# Patient Record
Sex: Female | Born: 1935
Health system: Southern US, Community
[De-identification: ages and names within clinical notes are randomized; demographics above are authoritative.]

## PROBLEM LIST (undated history)

## (undated) DIAGNOSIS — I739 Peripheral vascular disease, unspecified: Secondary | ICD-10-CM

## (undated) DIAGNOSIS — R112 Nausea with vomiting, unspecified: Secondary | ICD-10-CM

## (undated) DIAGNOSIS — I10 Essential (primary) hypertension: Secondary | ICD-10-CM

## (undated) DIAGNOSIS — M1990 Unspecified osteoarthritis, unspecified site: Secondary | ICD-10-CM

## (undated) DIAGNOSIS — I8390 Asymptomatic varicose veins of unspecified lower extremity: Secondary | ICD-10-CM

## (undated) DIAGNOSIS — Z9889 Other specified postprocedural states: Secondary | ICD-10-CM

## (undated) DIAGNOSIS — E785 Hyperlipidemia, unspecified: Secondary | ICD-10-CM

## (undated) DIAGNOSIS — R609 Edema, unspecified: Secondary | ICD-10-CM

## (undated) DIAGNOSIS — I82409 Acute embolism and thrombosis of unspecified deep veins of unspecified lower extremity: Secondary | ICD-10-CM

## (undated) DIAGNOSIS — T8859XA Other complications of anesthesia, initial encounter: Secondary | ICD-10-CM

## (undated) DIAGNOSIS — G629 Polyneuropathy, unspecified: Secondary | ICD-10-CM

## (undated) DIAGNOSIS — I809 Phlebitis and thrombophlebitis of unspecified site: Secondary | ICD-10-CM

## (undated) DIAGNOSIS — T4145XA Adverse effect of unspecified anesthetic, initial encounter: Secondary | ICD-10-CM

## (undated) HISTORY — DX: Peripheral vascular disease, unspecified: I73.9

## (undated) HISTORY — DX: Hyperlipidemia, unspecified: E78.5

## (undated) HISTORY — PX: EYE SURGERY: SHX253

## (undated) HISTORY — DX: Phlebitis and thrombophlebitis of unspecified site: I80.9

## (undated) HISTORY — DX: Asymptomatic varicose veins of unspecified lower extremity: I83.90

## (undated) HISTORY — DX: Edema, unspecified: R60.9

## (undated) HISTORY — PX: BACK SURGERY: SHX140

## (undated) HISTORY — PX: REPLACEMENT TOTAL KNEE: SUR1224

---

## 1991-06-04 HISTORY — PX: FRACTURE SURGERY: SHX138

## 1999-06-04 DIAGNOSIS — I82409 Acute embolism and thrombosis of unspecified deep veins of unspecified lower extremity: Secondary | ICD-10-CM

## 1999-06-04 HISTORY — DX: Acute embolism and thrombosis of unspecified deep veins of unspecified lower extremity: I82.409

## 2001-05-11 ENCOUNTER — Ambulatory Visit (HOSPITAL_COMMUNITY): Admission: RE | Admit: 2001-05-11 | Discharge: 2001-05-11 | Payer: Self-pay | Admitting: Internal Medicine

## 2004-06-18 ENCOUNTER — Ambulatory Visit (HOSPITAL_COMMUNITY): Admission: RE | Admit: 2004-06-18 | Discharge: 2004-06-18 | Payer: Self-pay | Admitting: Neurology

## 2005-04-17 ENCOUNTER — Ambulatory Visit: Payer: Self-pay | Admitting: Internal Medicine

## 2005-04-17 ENCOUNTER — Ambulatory Visit (HOSPITAL_COMMUNITY): Admission: RE | Admit: 2005-04-17 | Discharge: 2005-04-17 | Payer: Self-pay | Admitting: Internal Medicine

## 2010-05-30 ENCOUNTER — Ambulatory Visit (HOSPITAL_COMMUNITY)
Admission: RE | Admit: 2010-05-30 | Discharge: 2010-05-30 | Payer: Self-pay | Source: Home / Self Care | Attending: Internal Medicine | Admitting: Internal Medicine

## 2010-05-30 ENCOUNTER — Ambulatory Visit: Payer: Self-pay | Admitting: Internal Medicine

## 2010-07-04 ENCOUNTER — Other Ambulatory Visit (HOSPITAL_COMMUNITY): Payer: Self-pay | Admitting: Orthopedic Surgery

## 2010-07-04 ENCOUNTER — Encounter (HOSPITAL_COMMUNITY)
Admission: RE | Admit: 2010-07-04 | Discharge: 2010-07-04 | Disposition: A | Discharge status: Home / Self Care | Payer: Medicare Other | Source: Ambulatory Visit | Attending: Orthopedic Surgery | Admitting: Orthopedic Surgery

## 2010-07-04 ENCOUNTER — Encounter (HOSPITAL_COMMUNITY): Payer: Medicare Other

## 2010-07-04 DIAGNOSIS — J449 Chronic obstructive pulmonary disease, unspecified: Secondary | ICD-10-CM | POA: Insufficient documentation

## 2010-07-04 DIAGNOSIS — Z01818 Encounter for other preprocedural examination: Secondary | ICD-10-CM | POA: Insufficient documentation

## 2010-07-04 DIAGNOSIS — M1710 Unilateral primary osteoarthritis, unspecified knee: Secondary | ICD-10-CM | POA: Insufficient documentation

## 2010-07-04 DIAGNOSIS — IMO0002 Reserved for concepts with insufficient information to code with codable children: Secondary | ICD-10-CM | POA: Insufficient documentation

## 2010-07-04 DIAGNOSIS — J4489 Other specified chronic obstructive pulmonary disease: Secondary | ICD-10-CM | POA: Insufficient documentation

## 2010-07-04 LAB — COMPREHENSIVE METABOLIC PANEL
ALT: 15 U/L (ref 0–35)
AST: 18 U/L (ref 0–37)
Albumin: 3.7 g/dL (ref 3.5–5.2)
Alkaline Phosphatase: 58 U/L (ref 39–117)
BUN: 16 mg/dL (ref 6–23)
CO2: 31 mEq/L (ref 19–32)
Calcium: 9.9 mg/dL (ref 8.4–10.5)
Chloride: 101 mEq/L (ref 96–112)
Creatinine, Ser: 0.85 mg/dL (ref 0.4–1.2)
GFR calc Af Amer: >60 mL/min (ref >60)
GFR calc non Af Amer: >60 mL/min (ref >60)
Glucose, Bld: 141 mg/dL — ABNORMAL HIGH (ref 70–99)
Potassium: 4.9 mEq/L (ref 3.5–5.1)
Sodium: 137 mEq/L (ref 135–145)
Total Bilirubin: 0.7 mg/dL (ref 0.3–1.2)
Total Protein: 6.6 g/dL (ref 6.0–8.3)

## 2010-07-04 LAB — DIFFERENTIAL
Basophils Absolute: 0 10*3/uL (ref 0.0–0.1)
Basophils Relative: 1 % (ref 0–1)
Eosinophils Absolute: 0.2 10*3/uL (ref 0.0–0.7)
Eosinophils Relative: 3 % (ref 0–5)
Lymphocytes Relative: 32 % (ref 12–46)
Lymphs Abs: 1.9 10*3/uL (ref 0.7–4.0)
Monocytes Absolute: 0.5 10*3/uL (ref 0.1–1.0)
Monocytes Relative: 9 % (ref 3–12)
Neutro Abs: 3.3 10*3/uL (ref 1.7–7.7)
Neutrophils Relative %: 56 % (ref 43–77)

## 2010-07-04 LAB — URINALYSIS, ROUTINE W REFLEX MICROSCOPIC
Bilirubin Urine: NEGATIVE
Hgb urine dipstick: NEGATIVE
Ketones, ur: NEGATIVE mg/dL
Nitrite: NEGATIVE
Protein, ur: NEGATIVE mg/dL
Specific Gravity, Urine: 1.015 (ref 1.005–1.030)
Urine Glucose, Fasting: NEGATIVE mg/dL
Urobilinogen, UA: 0.2 mg/dL (ref 0.0–1.0)
pH: 6 (ref 5.0–8.0)

## 2010-07-04 LAB — CBC
HCT: 39 % (ref 36.0–46.0)
Hemoglobin: 13 g/dL (ref 12.0–15.0)
MCH: 29.7 pg (ref 26.0–34.0)
MCHC: 33.3 g/dL (ref 30.0–36.0)
MCV: 89.2 fL (ref 78.0–100.0)
Platelets: 259 10*3/uL (ref 150–400)
RBC: 4.37 MIL/uL (ref 3.87–5.11)
RDW: 13.2 % (ref 11.5–15.5)
WBC: 5.9 10*3/uL (ref 4.0–10.5)

## 2010-07-04 LAB — PROTIME-INR
INR: 0.9 (ref 0.00–1.49)
Prothrombin Time: 12.4 seconds (ref 11.6–15.2)

## 2010-07-04 LAB — ABO/RH: ABO/RH(D): O POS

## 2010-07-04 LAB — SURGICAL PCR SCREEN
MRSA, PCR: NEGATIVE
Staphylococcus aureus: POSITIVE — AB

## 2010-07-04 LAB — APTT: aPTT: 28 seconds (ref 24–37)

## 2010-07-06 LAB — URINE CULTURE: Culture  Setup Time: 201202011659

## 2010-07-09 ENCOUNTER — Inpatient Hospital Stay (HOSPITAL_COMMUNITY)
Admission: RE | Admit: 2010-07-09 | Discharge: 2010-07-13 | DRG: 470 | Disposition: A | Discharge status: Home / Self Care | Payer: Medicare Other | Source: Ambulatory Visit | Attending: Orthopedic Surgery | Admitting: Orthopedic Surgery

## 2010-07-09 DIAGNOSIS — D62 Acute posthemorrhagic anemia: Secondary | ICD-10-CM | POA: Diagnosis not present

## 2010-07-09 DIAGNOSIS — M1710 Unilateral primary osteoarthritis, unspecified knee: Principal | ICD-10-CM | POA: Diagnosis present

## 2010-07-09 DIAGNOSIS — E119 Type 2 diabetes mellitus without complications: Secondary | ICD-10-CM | POA: Diagnosis present

## 2010-07-09 LAB — HEMOGLOBIN A1C
Hgb A1c MFr Bld: 8.4 % — ABNORMAL HIGH (ref <5.7)
Mean Plasma Glucose: 194 mg/dL — ABNORMAL HIGH (ref <117)

## 2010-07-09 LAB — GLUCOSE, CAPILLARY
Glucose-Capillary: 172 mg/dL — ABNORMAL HIGH (ref 70–99)
Glucose-Capillary: 116 mg/dL — ABNORMAL HIGH (ref 70–99)
Glucose-Capillary: 113 mg/dL — ABNORMAL HIGH (ref 70–99)
Glucose-Capillary: 106 mg/dL — ABNORMAL HIGH (ref 70–99)
Glucose-Capillary: 110 mg/dL — ABNORMAL HIGH (ref 70–99)
Glucose-Capillary: 101 mg/dL — ABNORMAL HIGH (ref 70–99)
Glucose-Capillary: 245 mg/dL — ABNORMAL HIGH (ref 70–99)
Glucose-Capillary: 251 mg/dL — ABNORMAL HIGH (ref 70–99)

## 2010-07-10 LAB — BASIC METABOLIC PANEL
BUN: 13 mg/dL (ref 6–23)
CO2: 27 mEq/L (ref 19–32)
Calcium: 8.1 mg/dL — ABNORMAL LOW (ref 8.4–10.5)
Chloride: 99 mEq/L (ref 96–112)
Creatinine, Ser: 0.84 mg/dL (ref 0.4–1.2)
GFR calc Af Amer: >60 mL/min (ref >60)
GFR calc non Af Amer: >60 mL/min (ref >60)
Glucose, Bld: 224 mg/dL — ABNORMAL HIGH (ref 70–99)
Potassium: 4.7 mEq/L (ref 3.5–5.1)
Sodium: 131 mEq/L — ABNORMAL LOW (ref 135–145)

## 2010-07-10 LAB — CBC
HCT: 29.4 % — ABNORMAL LOW (ref 36.0–46.0)
Hemoglobin: 10 g/dL — ABNORMAL LOW (ref 12.0–15.0)
MCH: 30.5 pg (ref 26.0–34.0)
MCHC: 34 g/dL (ref 30.0–36.0)
MCV: 89.6 fL (ref 78.0–100.0)
Platelets: 198 10*3/uL (ref 150–400)
RBC: 3.28 MIL/uL — ABNORMAL LOW (ref 3.87–5.11)
RDW: 13.5 % (ref 11.5–15.5)
WBC: 7.4 10*3/uL (ref 4.0–10.5)

## 2010-07-10 LAB — GLUCOSE, CAPILLARY
Glucose-Capillary: 209 mg/dL — ABNORMAL HIGH (ref 70–99)
Glucose-Capillary: 198 mg/dL — ABNORMAL HIGH (ref 70–99)
Glucose-Capillary: 231 mg/dL — ABNORMAL HIGH (ref 70–99)
Glucose-Capillary: 237 mg/dL — ABNORMAL HIGH (ref 70–99)

## 2010-07-11 LAB — BASIC METABOLIC PANEL
BUN: 12 mg/dL (ref 6–23)
CO2: 24 mEq/L (ref 19–32)
Calcium: 7.9 mg/dL — ABNORMAL LOW (ref 8.4–10.5)
Chloride: 96 mEq/L (ref 96–112)
Creatinine, Ser: 0.63 mg/dL (ref 0.4–1.2)
GFR calc Af Amer: >60 mL/min (ref >60)
GFR calc non Af Amer: >60 mL/min (ref >60)
Glucose, Bld: 192 mg/dL — ABNORMAL HIGH (ref 70–99)
Potassium: 4.5 mEq/L (ref 3.5–5.1)
Sodium: 126 mEq/L — ABNORMAL LOW (ref 135–145)

## 2010-07-11 LAB — CBC
HCT: 26.5 % — ABNORMAL LOW (ref 36.0–46.0)
Hemoglobin: 9.1 g/dL — ABNORMAL LOW (ref 12.0–15.0)
MCH: 30 pg (ref 26.0–34.0)
MCHC: 34.3 g/dL (ref 30.0–36.0)
MCV: 87.5 fL (ref 78.0–100.0)
Platelets: 170 10*3/uL (ref 150–400)
RBC: 3.03 MIL/uL — ABNORMAL LOW (ref 3.87–5.11)
RDW: 13.1 % (ref 11.5–15.5)
WBC: 7.7 10*3/uL (ref 4.0–10.5)

## 2010-07-11 LAB — GLUCOSE, CAPILLARY
Glucose-Capillary: 202 mg/dL — ABNORMAL HIGH (ref 70–99)
Glucose-Capillary: 155 mg/dL — ABNORMAL HIGH (ref 70–99)
Glucose-Capillary: 98 mg/dL (ref 70–99)
Glucose-Capillary: 167 mg/dL — ABNORMAL HIGH (ref 70–99)

## 2010-07-12 LAB — CROSSMATCH
ABO/RH(D): O POS
Antibody Screen: NEGATIVE
Unit division: 0
Unit division: 0

## 2010-07-12 LAB — CBC
HCT: 27.4 % — ABNORMAL LOW (ref 36.0–46.0)
Hemoglobin: 9.6 g/dL — ABNORMAL LOW (ref 12.0–15.0)
MCH: 30.5 pg (ref 26.0–34.0)
MCHC: 35 g/dL (ref 30.0–36.0)
MCV: 87 fL (ref 78.0–100.0)
Platelets: 173 10*3/uL (ref 150–400)
RBC: 3.15 MIL/uL — ABNORMAL LOW (ref 3.87–5.11)
RDW: 13.1 % (ref 11.5–15.5)
WBC: 6.5 10*3/uL (ref 4.0–10.5)

## 2010-07-12 LAB — GLUCOSE, CAPILLARY
Glucose-Capillary: 104 mg/dL — ABNORMAL HIGH (ref 70–99)
Glucose-Capillary: 128 mg/dL — ABNORMAL HIGH (ref 70–99)
Glucose-Capillary: 94 mg/dL (ref 70–99)

## 2010-07-12 LAB — URINALYSIS, MICROSCOPIC ONLY
Bilirubin Urine: NEGATIVE
Ketones, ur: NEGATIVE mg/dL
Leukocytes, UA: NEGATIVE
Nitrite: NEGATIVE
Protein, ur: NEGATIVE mg/dL
Specific Gravity, Urine: 1.008 (ref 1.005–1.030)
Urine Glucose, Fasting: 100 mg/dL — AB
Urobilinogen, UA: 0.2 mg/dL (ref 0.0–1.0)
pH: 7 (ref 5.0–8.0)

## 2010-07-12 LAB — BASIC METABOLIC PANEL
BUN: 9 mg/dL (ref 6–23)
CO2: 23 mEq/L (ref 19–32)
Calcium: 8.1 mg/dL — ABNORMAL LOW (ref 8.4–10.5)
Chloride: 99 mEq/L (ref 96–112)
Creatinine, Ser: 0.67 mg/dL (ref 0.4–1.2)
GFR calc Af Amer: >60 mL/min (ref >60)
GFR calc non Af Amer: >60 mL/min (ref >60)
Glucose, Bld: 155 mg/dL — ABNORMAL HIGH (ref 70–99)
Potassium: 3.6 mEq/L (ref 3.5–5.1)
Sodium: 130 mEq/L — ABNORMAL LOW (ref 135–145)

## 2010-07-13 LAB — URINE CULTURE
Colony Count: NO GROWTH
Culture  Setup Time: 201202091733
Culture: NO GROWTH
Special Requests: NEGATIVE

## 2010-07-13 LAB — GLUCOSE, CAPILLARY
Glucose-Capillary: 142 mg/dL — ABNORMAL HIGH (ref 70–99)
Glucose-Capillary: 60 mg/dL — ABNORMAL LOW (ref 70–99)
Glucose-Capillary: 94 mg/dL (ref 70–99)
Glucose-Capillary: 182 mg/dL — ABNORMAL HIGH (ref 70–99)

## 2010-07-19 LAB — GLUCOSE, CAPILLARY: Glucose-Capillary: 120 mg/dL — ABNORMAL HIGH (ref 70–99)

## 2010-07-24 NOTE — Op Note (Signed)
NAME:  Kelly Weaver, Kelly Weaver                ACCOUNT NO.:  [id number]  MEDICAL RECORD NO.:  [id number]           PATIENT TYPE:  I  LOCATION:  5023                         FACILITY:  [facility]  PHYSICIAN:  Mila Homer. Sherlean Foot, M.D. DATE OF BIRTH:  [birth year]  DATE OF PROCEDURE:  07/09/2010 DATE OF DISCHARGE:                              OPERATIVE REPORT   SURGEON:  Mila Homer. Sherlean Foot, MD  ASSISTANT: 1. Altamese Cabal, PA-C 2. Skip Mayer, PA-C  ANESTHESIA:  General.  PREOPERATIVE DIAGNOSIS:  Right knee osteoarthritis.  POSTOPERATIVE DIAGNOSIS:  Right knee osteoarthritis.  PROCEDURE:  Right total knee arthroplasty.  INDICATIONS FOR PROCEDURE:  The patient is a 77 year old white female with failure of conservative measures for osteoarthritis of the knee. Informed consent was obtained.  DESCRIPTION OF PROCEDURE:  The patient was laid supine, administered general anesthesia.  Right knee was prepped and draped in the usual sterile fashion.  Extremity was exsanguinated with the Esmarch and tourniquet inflated to 375 mmHg due to the size of the leg.  I made a midline incision with a #10 blade.  I used a new blade to make a median parapatellar arthrotomy and performed synovectomy.  I elevated the deep MCL off the medial crest of the tibia and then everted the patella and measured 26-mm thick.  I reamed down 8.5 mm, drilled through lug holes through the 35-mm template and had recreated the native thickness.  I removed the prosthetic trial, went into flexion subluxing the patella laterally.  I used extramedullary alignment system on the tibia to make perpendicular cut to the anatomic axis of the tibia.  I then used the intramedullary system on the femur to make 6 degrees valgus cut.  I sized to a size F, pinned through 5 degrees external rotation holes and measured the posterior condylar angle.  I made the anterior and posterior chamfer cuts with a sagittal saw.  I then placed a lamina spreader in  the knee, removed the ACL, PCL, medial, and lateral menisci and posterior condylar osteophytes.  I then finished the femur with a size F finishing block, finished the tibia with a size 5 tibial tray drilling keel.  I then trialed with 5 tibia, F femur, 14 insert, 35 patella, had good flexion/extension, gap balance and good patellar tracking.  I chose these components, removed the trial components, copiously irrigated.  I then cemented the components, removed all excess cement in extension and at this point, I let down tourniquet and obtained hemostasis.  I then placed a Hemovac coming out superolaterally deep to the arthrotomy and pain catheter coming out supermedial and superficial to the arthrotomy.  I closed the arthrotomy with figure-of- eight #1 Vicryl sutures, deep soft tissues buried 0 Vicryl sutures, subcuticular 2-0 Vicryl stitches, and skin staples.  Dressed with Xeroform dressing, sponges, sterile Webril, and TED stocking.  COMPLICATIONS:  None.  DRAINS:  One Hemovac and one pain catheter.  ESTIMATED BLOOD LOSS:  300 mL.  TOURNIQUET TIME:  50 minutes.          ______________________________ Mila Homer Sherlean Foot, M.D.     SDL/MEDQ  D:  07/09/2010  T:  07/10/2010  Job:  [id number]  Electronically Signed by Georgena Spurling M.D. on 07/24/2010 09:07:54 PM

## 2010-08-13 LAB — GLUCOSE, CAPILLARY
Glucose-Capillary: 243 mg/dL — ABNORMAL HIGH (ref 70–99)
Glucose-Capillary: 304 mg/dL — ABNORMAL HIGH (ref 70–99)

## 2010-09-17 NOTE — Discharge Summary (Signed)
NAME:  Kelly Weaver, Kelly Weaver                ACCOUNT NO.:  [id number]  MEDICAL RECORD NO.:  [id number]           PATIENT TYPE:  I  LOCATION:  5023                         FACILITY:  [facility]  PHYSICIAN:  Mila Homer. Sherlean Foot, M.D. DATE OF BIRTH:  [birth year]  DATE OF ADMISSION:  07/09/2010 DATE OF DISCHARGE:  07/13/2010                              DISCHARGE SUMMARY   ADMISSION DIAGNOSIS:  Osteoarthritis of the right knee.  DISCHARGE DIAGNOSES: 1. Osteoarthritis of the right knee. 2. Status post right total knee arthroplasty. 3. Acute blood loss anemia, status post surgery.  PROCEDURE:  Right total knee arthroplasty.  HISTORY:  [name] is a 77 year old female who complained of pain in the right knee times several years, now interfering with activities of daily living.  Conservative treatments failed.  Risk and benefits of surgery were discussed with the patient, and the patient would like to proceed with the right total knee arthroplasty.  She has no known allergies.  ADMISSION MEDICATIONS:  Upon admission to the hospital, the patient was taking: 1. Calcium over-the-counter daily. 2. Crestor 5 mg daily. 3. Aspirin 81 mg daily, stopped on June 29, 2010. 4. Gabapentin 300 mg daily. 5. Metformin 500 mg twice daily. 6. Losartan 25 mg daily. 7. Lantus 19 units at bedtime. 8. NovoLog 6 units every morning. 9. Humalog mix 75/25 28 units daily.  HOSPITAL COURSE:  This is a 77 year old female admitted on July 09, 2010 after appropriate laboratory studies were obtained preoperatively as well as ancef on call to the operating room.  She was taken to the OR where she underwent a right TKA.  She tolerated the procedure well and was taken to PACU in good condition.  CPM was placed at that time and was placed on Dilaudid plus p.o. pain medication as needed.  Foley was placed intraoperatively.  On postop day #1, vital signs were stable.  The patient denied chest pain, shortness of breath,  or calf pain.  The patient was placed on Xarelto 10 mg p.o. daily at 8 a.m.  Consults with PT/OT and Care Management were made.  The patient was weightbearing as tolerated.  CPM 0-9 degrees for 6-8 hours per day and incentive spirometry was taught.  Postop day #2, the patient continued to progress with physical therapy albeit slowly.  Dressing was changed.  Marcaine pump and Hemovac were discontinued.  Foley was discontinued.  The patient was continued on p.o. pain medications.  Postop day #3, the patient continued to progress with physical therapy and was discharged after passing physical therapy goals.  LABORATORY STUDIES:  Upon discharge from the hospital, the patient's H and H was 9.1 and 26.5, white blood cell count was 7.7, platelets of 170.  Sodium was 134, potassium was 3.6, chloride was 99, CO2 was 23, glucose was 155, BUN was 9, creatinine was 0.67.  Discharge instructions were given to the patient.  No restrictions in diet.  She should follow her regular diabetic diet.  Follow the instruction sheet provided.  Increase activity slowly.  May use a cane or walker, weightbearing as tolerated.  No lifting or driving for 6 weeks  and home health has been established.  DISCHARGE MEDICATIONS:  Upon discharge from the hospital, the patient was given: 1. Xarelto 10 mg p.o. daily, #12. 2. Robaxin 500 mg 1-2 tablets every 6-8 hours as needed for spasm. 3. Oxycodone 5 mg 1-2 tablets every 4-6 hours as needed for pain. 4. OxyContin 20 mg 1 tablet twice daily.  The patient will follow with Dr. Sherlean Foot in 2 weeks.  She is to call for that appointment at [phone].  The patient is discharged in improved condition.    ______________________________ Altamese Cabal, PA-C   ______________________________ Mila Homer. Sherlean Foot, M.D.    MJ/MEDQ  D:  08/16/2010  T:  08/17/2010  Job:  [id number]  Electronically Signed by Altamese Cabal PA-C on 09/11/2010 02:27:53 PM Electronically Signed by  Georgena Spurling M.D. on 09/16/2010 02:30:37 PM

## 2010-10-19 NOTE — Op Note (Signed)
NAME:  Kelly Weaver, Kelly Weaver                ACCOUNT NO.:  [id number]   MEDICAL RECORD NO.:  [id number]          PATIENT TYPE:  AMB   LOCATION:  DAY                           FACILITY:  [facility]   PHYSICIAN:  Lionel December, M.D.    DATE OF BIRTH:  Jul 24, 1933   DATE OF PROCEDURE:  04/17/2005  DATE OF DISCHARGE:                                 OPERATIVE REPORT   INDICATIONS:  Kelly Weaver is a 77 year old Caucasian female who is undergoing high-  risk screening colonoscopy. One sister died of colon carcinoma in her 3s;  another one also had disease in her 54s but died of unrelated causes. She  had two small polyps removed her last exam, but these were nonedematous.  Procedure risks were reviewed with the patient, and informed consent was  obtained.   PREMEDICATION:  Demerol 25 mg IV, Versed 5 mg IV in divided dose.   FINDINGS:  Procedure performed in endoscopy suite. The patient's vital signs  and O2 saturation were monitored during the procedure remained stable. The  patient was placed in left lateral position. Rectal examination performed.  No abnormality noted on external or digital exam. Olympus videoscope was  placed in rectum and advanced under vision in sigmoid colon and beyond.  Preparation was satisfactory. Scope was passed into cecum which was  identified by appendiceal orifice and ileocecal valve. She had a solid piece  of stool in this area which was moved around. As the scope was withdrawn,  colonic mucosa was carefully examined. It was normal throughout. Rectal  mucosa similarly was normal. Scope was retroflexed to examine anorectal  junction which was unremarkable. Endoscope was straightened and withdrawn.  The patient tolerated the procedure well.   FINAL DIAGNOSIS:  Normal colonoscopy.   RECOMMENDATIONS:  Will check her glucose levels and give her short-acting  insulin depending on her level. She will resume her Humalog 75/25 at 18  units with her breakfast and thereafter her usual  dose.   She should continue yearly Hemoccults and consider next exam in five years  from now.      Lionel December, M.D.  Electronically Signed     NR/MEDQ  D:  04/17/2005  T:  04/17/2005  Job:  [id number]   cc:   Weyman Pedro, M.D.  [place], [place]

## 2010-11-26 ENCOUNTER — Ambulatory Visit
Admission: RE | Admit: 2010-11-26 | Discharge: 2010-11-26 | Disposition: A | Discharge status: Home / Self Care | Payer: Medicare Other | Source: Ambulatory Visit | Attending: Neurology | Admitting: Neurology

## 2010-11-26 ENCOUNTER — Other Ambulatory Visit: Payer: Self-pay | Admitting: Neurology

## 2010-11-26 ENCOUNTER — Ambulatory Visit
Admission: RE | Admit: 2010-11-26 | Discharge: 2010-11-26 | Disposition: A | Discharge status: Home / Self Care | Payer: 59 | Source: Ambulatory Visit | Attending: Neurology | Admitting: Neurology

## 2010-11-26 DIAGNOSIS — M79606 Pain in leg, unspecified: Secondary | ICD-10-CM

## 2010-11-28 ENCOUNTER — Other Ambulatory Visit: Payer: Self-pay | Admitting: Neurology

## 2010-11-28 DIAGNOSIS — M79609 Pain in unspecified limb: Secondary | ICD-10-CM

## 2010-11-29 ENCOUNTER — Ambulatory Visit
Admission: RE | Admit: 2010-11-29 | Discharge: 2010-11-29 | Disposition: A | Discharge status: Home / Self Care | Payer: Medicare Other | Source: Ambulatory Visit | Attending: Neurology | Admitting: Neurology

## 2010-11-29 DIAGNOSIS — M79609 Pain in unspecified limb: Secondary | ICD-10-CM

## 2010-12-04 ENCOUNTER — Other Ambulatory Visit: Payer: Self-pay | Admitting: Neurology

## 2010-12-04 DIAGNOSIS — M79604 Pain in right leg: Secondary | ICD-10-CM

## 2010-12-06 ENCOUNTER — Ambulatory Visit
Admission: RE | Admit: 2010-12-06 | Discharge: 2010-12-06 | Disposition: A | Discharge status: Home / Self Care | Payer: Medicare Other | Source: Ambulatory Visit | Attending: Neurology | Admitting: Neurology

## 2010-12-06 ENCOUNTER — Other Ambulatory Visit: Payer: Self-pay | Admitting: Neurology

## 2010-12-06 DIAGNOSIS — M79604 Pain in right leg: Secondary | ICD-10-CM

## 2011-01-02 ENCOUNTER — Encounter (HOSPITAL_COMMUNITY)
Admission: RE | Admit: 2011-01-02 | Discharge: 2011-01-02 | Disposition: A | Discharge status: Home / Self Care | Payer: Medicare Other | Source: Ambulatory Visit | Attending: Neurosurgery | Admitting: Neurosurgery

## 2011-01-02 LAB — BASIC METABOLIC PANEL
BUN: 17 mg/dL (ref 6–23)
CO2: 34 mEq/L — ABNORMAL HIGH (ref 19–32)
Calcium: 10.3 mg/dL (ref 8.4–10.5)
Chloride: 101 mEq/L (ref 96–112)
Creatinine, Ser: 0.56 mg/dL (ref 0.50–1.10)
GFR calc Af Amer: >60 mL/min (ref >60)
GFR calc non Af Amer: >60 mL/min (ref >60)
Glucose, Bld: 117 mg/dL — ABNORMAL HIGH (ref 70–99)
Potassium: 5.2 mEq/L — ABNORMAL HIGH (ref 3.5–5.1)
Sodium: 141 mEq/L (ref 135–145)

## 2011-01-02 LAB — CBC
HCT: 38.2 % (ref 36.0–46.0)
Hemoglobin: 13.1 g/dL (ref 12.0–15.0)
MCH: 30.6 pg (ref 26.0–34.0)
MCHC: 34.3 g/dL (ref 30.0–36.0)
MCV: 89.3 fL (ref 78.0–100.0)
Platelets: 248 10*3/uL (ref 150–400)
RBC: 4.28 MIL/uL (ref 3.87–5.11)
RDW: 13.5 % (ref 11.5–15.5)
WBC: 5.4 10*3/uL (ref 4.0–10.5)

## 2011-01-02 LAB — SURGICAL PCR SCREEN
MRSA, PCR: NEGATIVE
Staphylococcus aureus: NEGATIVE

## 2011-01-10 ENCOUNTER — Inpatient Hospital Stay (HOSPITAL_COMMUNITY): Payer: Medicare Other

## 2011-01-10 ENCOUNTER — Inpatient Hospital Stay (HOSPITAL_COMMUNITY)
Admission: RE | Admit: 2011-01-10 | Discharge: 2011-01-11 | DRG: 491 | Disposition: A | Discharge status: Home / Self Care | Payer: Medicare Other | Source: Ambulatory Visit | Attending: Neurosurgery | Admitting: Neurosurgery

## 2011-01-10 DIAGNOSIS — M5126 Other intervertebral disc displacement, lumbar region: Principal | ICD-10-CM | POA: Diagnosis present

## 2011-01-10 DIAGNOSIS — Z01812 Encounter for preprocedural laboratory examination: Secondary | ICD-10-CM

## 2011-01-10 DIAGNOSIS — Z794 Long term (current) use of insulin: Secondary | ICD-10-CM

## 2011-01-10 DIAGNOSIS — M51379 Other intervertebral disc degeneration, lumbosacral region without mention of lumbar back pain or lower extremity pain: Secondary | ICD-10-CM | POA: Diagnosis present

## 2011-01-10 DIAGNOSIS — E119 Type 2 diabetes mellitus without complications: Secondary | ICD-10-CM | POA: Diagnosis present

## 2011-01-10 DIAGNOSIS — M5137 Other intervertebral disc degeneration, lumbosacral region: Secondary | ICD-10-CM | POA: Diagnosis present

## 2011-01-10 DIAGNOSIS — Z7982 Long term (current) use of aspirin: Secondary | ICD-10-CM

## 2011-01-10 DIAGNOSIS — Z96659 Presence of unspecified artificial knee joint: Secondary | ICD-10-CM

## 2011-01-10 DIAGNOSIS — Z86718 Personal history of other venous thrombosis and embolism: Secondary | ICD-10-CM

## 2011-01-10 DIAGNOSIS — Z79899 Other long term (current) drug therapy: Secondary | ICD-10-CM

## 2011-01-10 DIAGNOSIS — M47817 Spondylosis without myelopathy or radiculopathy, lumbosacral region: Secondary | ICD-10-CM | POA: Diagnosis present

## 2011-01-10 LAB — GLUCOSE, CAPILLARY
Glucose-Capillary: 149 mg/dL — ABNORMAL HIGH (ref 70–99)
Glucose-Capillary: 217 mg/dL — ABNORMAL HIGH (ref 70–99)
Glucose-Capillary: 195 mg/dL — ABNORMAL HIGH (ref 70–99)
Glucose-Capillary: 311 mg/dL — ABNORMAL HIGH (ref 70–99)
Glucose-Capillary: 404 mg/dL — ABNORMAL HIGH (ref 70–99)
Glucose-Capillary: 348 mg/dL — ABNORMAL HIGH (ref 70–99)
Glucose-Capillary: 324 mg/dL — ABNORMAL HIGH (ref 70–99)

## 2011-01-11 LAB — POCT I-STAT GLUCOSE
Glucose, Bld: 192 mg/dL — ABNORMAL HIGH (ref 70–99)
Operator id: 212621

## 2011-01-11 LAB — GLUCOSE, CAPILLARY
Glucose-Capillary: 254 mg/dL — ABNORMAL HIGH (ref 70–99)
Glucose-Capillary: 281 mg/dL — ABNORMAL HIGH (ref 70–99)

## 2011-01-24 NOTE — Op Note (Signed)
Kelly Weaver, [name]                ACCOUNT NO.:  [id number]  MEDICAL RECORD NO.:  [id number]  LOCATION:  3528                         FACILITY:  [facility]  PHYSICIAN:  Hewitt Shorts, M.D.DATE OF BIRTH:  Dec 18, 1933  DATE OF PROCEDURE:  01/10/2011 DATE OF DISCHARGE:                              OPERATIVE REPORT   PREOPERATIVE DIAGNOSES:  Right L4-5 lumbar disk herniation, lumbar degenerative disk disease, lumbar spondylosis and lumbar radiculopathy.  POSTOPERATIVE DIAGNOSES:  Right L4-5 lumbar disk herniation, lumbar degenerative disk disease, lumbar spondylosis and lumbar radiculopathy.  PROCEDURE:  Right L4-5 lumbar laminotomy and microdiskectomy with microdissection and the operating microscope.  SURGEON:  Hewitt Shorts, MD  ASSISTANT:  Stefani Dama, MD  ANESTHESIA:  General endotracheal.  INDICATIONS:  The patient is a 77 year old woman who presented with right lumbar radiculopathy.  MRI scan revealed right L4-5 disk herniation with a fragment that had migrated caudally behind the body of L5.  Decision made to proceed with laminotomy and microdiskectomy.  PROCEDURE:  The patient was brought to the operating room and placed under general endotracheal anesthesia. The patient was turned to a prone position.  Lumbar region was prepped with Betadine soap and solution and draped in sterile fashion.  The midline was infiltrated with local anesthetic with epinephrine and an x-ray was taken, the L4-5 level identified.  A midline incision was made over the L4-5 level, carried down through the subcutaneous tissue.  Bipolar cautery and electrocautery were used to maintain hemostasis.  The lumbar fascia was then incised and paraspinal muscles were dissected from the spinous process and lamina in subperiosteal fashion.  Additional x-rays were taken.  The L4-5 interlaminar space was identified and then the operating microscope was draped and brought into the field  for additional navigation, illumination and visualization.  The remainder of the decompression was performed using microdissection microsurgical technique.  Laminotomy was performed using the high-speed drill and Kerrison punches.  Ligamentum flavum was carefully removed.  We identified the thecal sac and exiting L5 nerve roots.  These structures were gently retracted medially and the disk herniation identified, it was subligamentous and we incised the remaining ligamentous fibers and spondylotic fragment was removed.  We continued to remove the spondylotic overgrowth and subligamentous disk herniation, entered into the disk space and continued diskectomy using variety of pituitary rongeurs.  In the end, all loose fragments of disk material removed from both disk space and epidural space and the spondylotic impingement as well was removed and good decompression of thecal sac and nerve root was achieved.  The wound was irrigated with bacitracin solution.  Hemostasis was established with the use of bipolar cautery as well as Gelfoam soaked in thrombin. The Gelfoam was removed. The wound irrigated again. Hemostasis confirmed and then we instilled 2 mL of fentanyl and 80 mg of Depo-Medrol into the epidural space and proceeded with closure.  Deep fascia closed with interrupted undyed 1 Vicryl sutures.  Scarpa fascia closed with interrupted undyed 1 Vicryl sutures.  Subcutaneous, subcuticular closed with interrupted inverted 2-0 undyed Vicryl sutures. Skin was approximated with Dermabond.  Procedure was tolerated well. Estimated blood loss was 50 mL.  Sponge and needle count  correct. Following surgery, the patient was turned back to supine position, to be reversed from the anesthetic, extubated and transferred to the recovery room for further care.     Hewitt Shorts, M.D.     RWN/MEDQ  D:  01/10/2011  T:  01/10/2011  Job:  [id number]  Electronically Signed by Shirlean Kelly M.D. on  01/24/2011 08:10:59 AM

## 2011-01-30 DIAGNOSIS — R0602 Shortness of breath: Secondary | ICD-10-CM

## 2011-03-05 ENCOUNTER — Ambulatory Visit
Admission: RE | Admit: 2011-03-05 | Discharge: 2011-03-05 | Disposition: A | Discharge status: Home / Self Care | Payer: Medicare Other | Source: Ambulatory Visit | Attending: Neurology | Admitting: Neurology

## 2011-03-05 ENCOUNTER — Other Ambulatory Visit: Payer: Self-pay | Admitting: Neurology

## 2011-03-05 DIAGNOSIS — R52 Pain, unspecified: Secondary | ICD-10-CM

## 2011-03-19 ENCOUNTER — Encounter: Payer: Self-pay | Admitting: Vascular Surgery

## 2011-03-25 ENCOUNTER — Encounter: Payer: Self-pay | Admitting: Vascular Surgery

## 2011-03-26 ENCOUNTER — Ambulatory Visit (INDEPENDENT_AMBULATORY_CARE_PROVIDER_SITE_OTHER): Payer: 59 | Admitting: Vascular Surgery

## 2011-03-26 ENCOUNTER — Encounter: Payer: Self-pay | Admitting: Vascular Surgery

## 2011-03-26 VITALS — BP 137/74 | HR 70 | Resp 16 | Ht 65.0 in | Wt 178.4 lb

## 2011-03-26 DIAGNOSIS — I70219 Atherosclerosis of native arteries of extremities with intermittent claudication, unspecified extremity: Secondary | ICD-10-CM

## 2011-03-26 NOTE — Progress Notes (Signed)
The patient presents today for evaluation of lower extremity symptoms. She reports a long history of left leg swelling. She had a right total knee replacement in February of 2012. He had no difficulties with this. He had lumbar disc surgery in August of 2012. She reports bilateral lower extremity swelling following the procedure and this is resolved to some degree but still has persistent swelling in her left leg. She has no history of DVT. She did undergo venous duplex a copy of the report from this from August of 2012 showing no evidence of DVT. She did have some thrombus in her small saphenous vein on the left. She does have a history of some bleeding from a superficial telangiectasia at the level of her left ankle while shaving. This was treated with pressure.  Past Medical History  Diagnosis Date  . Diabetes mellitus   . Peripheral vascular disease   . Hyperlipidemia   . Edema   . Thrombophlebitis   . Varicose veins     History  Substance Use Topics  . Smoking status: Never Smoker   . Smokeless tobacco: Not on file  . Alcohol Use: No    History reviewed. No pertinent family history.  No Known Allergies  Current outpatient prescriptions:aspirin EC 81 MG tablet, Take 81 mg by mouth daily.  , Disp: , Rfl: ;  Calcium Carbonate-Vitamin D (CALCIUM 600+D) 600-200 MG-UNIT TABS, Take 1 tablet by mouth 3 (three) times daily. , Disp: , Rfl: ;  fish oil-omega-3 fatty acids 1000 MG capsule, Take 2 g by mouth daily. Taking two 1200 mg. Tablets at dinner., Disp: , Rfl: ;  gabapentin (NEURONTIN) 300 MG capsule, Take 300 mg by mouth at bedtime. , Disp: , Rfl:  glucose blood test strip, 1 each by Other route as needed. Use as instructed , Disp: , Rfl: ;  insulin aspart (NOVOLOG) 100 UNIT/ML injection, Inject 6 Units into the skin daily. , Disp: , Rfl: ;  insulin glargine (LANTUS) 100 UNIT/ML injection, Inject 19 Units into the skin at bedtime. , Disp: , Rfl:  insulin lispro protamine-insulin lispro  (HUMALOG 75/25) (75-25) 100 UNIT/ML SUSP, Inject 29 Units into the skin daily with breakfast. , Disp: , Rfl: ;  Insulin Pen Needle (NOVOFINE 31) 31G X 6 MM MISC, by Does not apply route.  , Disp: , Rfl: ;  metFORMIN (GLUCOPHAGE) 500 MG tablet, Take 500 mg by mouth 2 (two) times daily with a meal.  , Disp: , Rfl: ;  rosuvastatin (CRESTOR) 5 MG tablet, Take 5 mg by mouth daily.  , Disp: , Rfl:  valsartan (DIOVAN) 40 MG tablet, Take 40 mg by mouth daily.  , Disp: , Rfl: ;  zoster vaccine live, PF, (ZOSTAVAX) 96045 UNT/0.65ML injection, Inject 0.65 mLs into the skin once.  , Disp: , Rfl: ;  furosemide (LASIX) 40 MG tablet, Take 40 mg by mouth 2 (two) times daily.  , Disp: , Rfl: ;  HYDROmorphone (DILAUDID) 2 MG tablet, Take 2 mg by mouth every 4 (four) hours as needed.  , Disp: , Rfl:  meclizine (ANTIVERT) 25 MG tablet, Take 25 mg by mouth 3 (three) times daily as needed.  , Disp: , Rfl: ;  methocarbamol (ROBAXIN) 500 MG tablet, Take 500 mg by mouth 2 (two) times daily.  , Disp: , Rfl: ;  Multiple Vitamin (MULTIVITAMIN) capsule, Take 1 capsule by mouth daily.  , Disp: , Rfl: ;  nystatin (MYCOSTATIN) 100000 UNIT/ML suspension, Take 500,000 Units by mouth 2 (two)  times daily.  , Disp: , Rfl:  oxyCODONE-acetaminophen (PERCOCET) 7.5-325 MG per tablet, Take 1 tablet by mouth every 4 (four) hours as needed.  , Disp: , Rfl: ;  potassium chloride (KLOR-CON) 10 MEQ CR tablet, Take 10 mEq by mouth 2 (two) times daily.  , Disp: , Rfl: ;  Red Yeast Rice 600 MG CAPS, Take 1 capsule by mouth daily.  , Disp: , Rfl: ;  vitamin E 400 UNIT capsule, Take 400 Units by mouth daily.  , Disp: , Rfl:   BP 137/74  Pulse 70  Resp 16  Ht 5\' 5"  (1.651 m)  Wt 178 lb 6.4 oz (80.922 kg)  BMI 29.69 kg/m2  Body mass index is 29.69 kg/(m^2).       Review of systems: GU frequent urination. Musculoskeletal arthritis. Otherwise negative except for history of present illness.  Physical exam: Well-developed well-nourished white female no  acute distress. HEENT normal. Radial femoral and dorsalis pedis pulses 2+ bilaterally. A well-healed incision over her right anterior knee from knee replacement. Otherwise no major deformities musculoskeletal system. She does have no open ulcerations or skin but does have marked telangiectasia over her left ankle and does have varicosities in the medial aspect of her left calf.  Impression and plan: Normal arterial exam in her lower extremities.-like swelling in her left leg most likely related to valvular incompetence. She did have a venous duplex showing no evidence of DVT in August of 2012. She is not having any pain associated with the varicosities and no significant complications other than one slight episode of bleeding. I discussed the importance of continued elevation. Also recommended knee-high graduated compression garments for her left leg to reduce her swelling and venous hypertension. Would not recommend any further evaluation or treatment this times and she has minimal symptoms and no significant complications. She was reassured with this discussion will see [place] again on an as-needed basis

## 2011-04-03 DIAGNOSIS — H269 Unspecified cataract: Secondary | ICD-10-CM | POA: Insufficient documentation

## 2011-04-09 ENCOUNTER — Other Ambulatory Visit: Payer: Medicare Other

## 2011-04-11 ENCOUNTER — Encounter: Payer: Self-pay | Admitting: Vascular Surgery

## 2011-04-11 DIAGNOSIS — I70219 Atherosclerosis of native arteries of extremities with intermittent claudication, unspecified extremity: Secondary | ICD-10-CM | POA: Insufficient documentation

## 2011-05-03 ENCOUNTER — Encounter: Payer: Self-pay | Admitting: Internal Medicine

## 2011-05-08 ENCOUNTER — Other Ambulatory Visit: Payer: Self-pay | Admitting: Dermatology

## 2012-09-22 DIAGNOSIS — Z96659 Presence of unspecified artificial knee joint: Secondary | ICD-10-CM | POA: Insufficient documentation

## 2012-10-19 ENCOUNTER — Encounter (HOSPITAL_COMMUNITY): Payer: Self-pay | Admitting: Pharmacy Technician

## 2012-10-21 ENCOUNTER — Other Ambulatory Visit: Payer: Self-pay | Admitting: Orthopedic Surgery

## 2012-10-23 ENCOUNTER — Ambulatory Visit (HOSPITAL_COMMUNITY)
Admission: RE | Admit: 2012-10-23 | Discharge: 2012-10-23 | Disposition: A | Discharge status: Home / Self Care | Payer: Medicare Other | Source: Ambulatory Visit | Attending: Orthopedic Surgery | Admitting: Orthopedic Surgery

## 2012-10-23 ENCOUNTER — Other Ambulatory Visit (HOSPITAL_COMMUNITY): Payer: Medicare Other

## 2012-10-23 ENCOUNTER — Encounter (HOSPITAL_COMMUNITY): Payer: Self-pay

## 2012-10-23 ENCOUNTER — Encounter (HOSPITAL_COMMUNITY)
Admission: RE | Admit: 2012-10-23 | Discharge: 2012-10-23 | Disposition: A | Discharge status: Home / Self Care | Payer: Medicare Other | Source: Ambulatory Visit | Attending: Orthopedic Surgery | Admitting: Orthopedic Surgery

## 2012-10-23 DIAGNOSIS — Z01812 Encounter for preprocedural laboratory examination: Secondary | ICD-10-CM | POA: Insufficient documentation

## 2012-10-23 DIAGNOSIS — E119 Type 2 diabetes mellitus without complications: Secondary | ICD-10-CM | POA: Insufficient documentation

## 2012-10-23 DIAGNOSIS — Z01818 Encounter for other preprocedural examination: Secondary | ICD-10-CM | POA: Insufficient documentation

## 2012-10-23 DIAGNOSIS — I10 Essential (primary) hypertension: Secondary | ICD-10-CM | POA: Insufficient documentation

## 2012-10-23 HISTORY — DX: Unspecified osteoarthritis, unspecified site: M19.90

## 2012-10-23 HISTORY — DX: Polyneuropathy, unspecified: G62.9

## 2012-10-23 HISTORY — DX: Other specified postprocedural states: Z98.890

## 2012-10-23 HISTORY — DX: Nausea with vomiting, unspecified: R11.2

## 2012-10-23 HISTORY — DX: Acute embolism and thrombosis of unspecified deep veins of unspecified lower extremity: I82.409

## 2012-10-23 HISTORY — DX: Other complications of anesthesia, initial encounter: T88.59XA

## 2012-10-23 HISTORY — DX: Adverse effect of unspecified anesthetic, initial encounter: T41.45XA

## 2012-10-23 LAB — SURGICAL PCR SCREEN
MRSA, PCR: NEGATIVE
Staphylococcus aureus: NEGATIVE

## 2012-10-23 LAB — CBC WITH DIFFERENTIAL/PLATELET
Basophils Absolute: 0 10*3/uL (ref 0.0–0.1)
Basophils Relative: 1 % (ref 0–1)
Eosinophils Absolute: 0.2 10*3/uL (ref 0.0–0.7)
Eosinophils Relative: 4 % (ref 0–5)
HCT: 40.1 % (ref 36.0–46.0)
Hemoglobin: 14 g/dL (ref 12.0–15.0)
Lymphocytes Relative: 37 % (ref 12–46)
Lymphs Abs: 1.9 10*3/uL (ref 0.7–4.0)
MCH: 31 pg (ref 26.0–34.0)
MCHC: 34.9 g/dL (ref 30.0–36.0)
MCV: 88.7 fL (ref 78.0–100.0)
Monocytes Absolute: 0.6 10*3/uL (ref 0.1–1.0)
Monocytes Relative: 12 % (ref 3–12)
Neutro Abs: 2.4 10*3/uL (ref 1.7–7.7)
Neutrophils Relative %: 47 % (ref 43–77)
Platelets: 267 10*3/uL (ref 150–400)
RBC: 4.52 MIL/uL (ref 3.87–5.11)
RDW: 13.4 % (ref 11.5–15.5)
WBC: 5.2 10*3/uL (ref 4.0–10.5)

## 2012-10-23 LAB — COMPREHENSIVE METABOLIC PANEL
ALT: 14 U/L (ref 0–35)
AST: 16 U/L (ref 0–37)
Albumin: 3.7 g/dL (ref 3.5–5.2)
Alkaline Phosphatase: 86 U/L (ref 39–117)
BUN: 14 mg/dL (ref 6–23)
CO2: 30 mEq/L (ref 19–32)
Calcium: 10.5 mg/dL (ref 8.4–10.5)
Chloride: 100 mEq/L (ref 96–112)
Creatinine, Ser: 0.67 mg/dL (ref 0.50–1.10)
GFR calc Af Amer: >90 mL/min (ref >90)
GFR calc non Af Amer: 83 mL/min — ABNORMAL LOW (ref >90)
Glucose, Bld: 88 mg/dL (ref 70–99)
Potassium: 4.4 mEq/L (ref 3.5–5.1)
Sodium: 139 mEq/L (ref 135–145)
Total Bilirubin: 0.6 mg/dL (ref 0.3–1.2)
Total Protein: 7.4 g/dL (ref 6.0–8.3)

## 2012-10-23 LAB — URINALYSIS, ROUTINE W REFLEX MICROSCOPIC
Bilirubin Urine: NEGATIVE
Glucose, UA: 100 mg/dL — AB
Hgb urine dipstick: NEGATIVE
Ketones, ur: NEGATIVE mg/dL
Leukocytes, UA: NEGATIVE
Nitrite: NEGATIVE
Protein, ur: NEGATIVE mg/dL
Specific Gravity, Urine: 1.014 (ref 1.005–1.030)
Urobilinogen, UA: 0.2 mg/dL (ref 0.0–1.0)
pH: 7 (ref 5.0–8.0)

## 2012-10-23 LAB — TYPE AND SCREEN
ABO/RH(D): O POS
Antibody Screen: NEGATIVE

## 2012-10-23 LAB — PROTIME-INR
INR: 0.89 (ref 0.00–1.49)
Prothrombin Time: 12 seconds (ref 11.6–15.2)

## 2012-10-23 LAB — APTT: aPTT: 27 seconds (ref 24–37)

## 2012-10-23 NOTE — Pre-Procedure Instructions (Addendum)
Kelly Weaver  10/23/2012   Your procedure is scheduled on:  Monday, June 2nd.  Report to [facility] at 5:30 AM.  Call this number if you have problems the morning of surgery: [phone]   Remember:   Do not eat food or drink liquids after midnight.   Take these medicines the morning of surgery with A SIP OF WATER:  gabapentin (NEURONTIN).   Stop taking Aspirin, Coumadin, Plavix, Effient and Herbal medications (Fish Oil).  Do not take any NSAIDs ie: Ibuprofen,  Advil,Naproxen or any medication containing Aspirin.    Do not wear jewelry, make-up or nail polish.  Do not wear lotions, powders, or perfumes. You may wear deodorant.  Do not shave 48 hours prior to surgery. Men may shave face and neck.  Do not bring valuables to the hospital.  Contacts, dentures or bridgework may not be worn into surgery.  Leave suitcase in the car. After surgery it may be brought to your room.  For patients admitted to the hospital, checkout time is 11:00 AM the day of discharge.   Patients discharged the day of surgery will not be allowed to drive home.  Name and phone number of your driver: -   Special Instructions: Shower using CHG 2 nights before surgery and the night before surgery.  If you shower the day of surgery use CHG.  Use special wash - you have one bottle of CHG for all showers.  You should use approximately 1/3 of the bottle for each shower. N/A   Please read over the following fact sheets that you were given: Pain Booklet, Coughing and Deep Breathing, Blood Transfusion Information and Surgical Site Infection Prevention

## 2012-10-24 LAB — URINE CULTURE: Colony Count: 15000

## 2012-10-27 ENCOUNTER — Other Ambulatory Visit: Payer: Self-pay | Admitting: Orthopedic Surgery

## 2012-11-01 MED ORDER — CEFAZOLIN SODIUM-DEXTROSE 2-3 GM-% IV SOLR
2.0000 g | INTRAVENOUS | Status: AC
  Administered 2012-11-02: 2 g via INTRAVENOUS
  Filled 2012-11-01: qty 50

## 2012-11-02 ENCOUNTER — Encounter (HOSPITAL_COMMUNITY)
Admission: RE | Disposition: A | Discharge status: Home Health | Payer: Self-pay | Source: Ambulatory Visit | Attending: Orthopedic Surgery

## 2012-11-02 ENCOUNTER — Encounter (HOSPITAL_COMMUNITY): Payer: Self-pay | Admitting: Surgery

## 2012-11-02 ENCOUNTER — Inpatient Hospital Stay (HOSPITAL_COMMUNITY)
Admission: RE | Admit: 2012-11-02 | Discharge: 2012-11-05 | DRG: 470 | Disposition: A | Discharge status: Home Health | Payer: Medicare Other | Source: Ambulatory Visit | Attending: Orthopedic Surgery | Admitting: Orthopedic Surgery

## 2012-11-02 ENCOUNTER — Inpatient Hospital Stay (HOSPITAL_COMMUNITY): Payer: Medicare Other | Admitting: Certified Registered"

## 2012-11-02 ENCOUNTER — Encounter (HOSPITAL_COMMUNITY): Payer: Self-pay | Admitting: Certified Registered"

## 2012-11-02 DIAGNOSIS — I798 Other disorders of arteries, arterioles and capillaries in diseases classified elsewhere: Secondary | ICD-10-CM

## 2012-11-02 DIAGNOSIS — I70219 Atherosclerosis of native arteries of extremities with intermittent claudication, unspecified extremity: Secondary | ICD-10-CM | POA: Diagnosis present

## 2012-11-02 DIAGNOSIS — M1710 Unilateral primary osteoarthritis, unspecified knee: Principal | ICD-10-CM | POA: Diagnosis present

## 2012-11-02 DIAGNOSIS — Z96652 Presence of left artificial knee joint: Secondary | ICD-10-CM

## 2012-11-02 DIAGNOSIS — D62 Acute posthemorrhagic anemia: Secondary | ICD-10-CM | POA: Diagnosis not present

## 2012-11-02 DIAGNOSIS — Z86718 Personal history of other venous thrombosis and embolism: Secondary | ICD-10-CM

## 2012-11-02 DIAGNOSIS — Z79899 Other long term (current) drug therapy: Secondary | ICD-10-CM

## 2012-11-02 DIAGNOSIS — I10 Essential (primary) hypertension: Secondary | ICD-10-CM

## 2012-11-02 DIAGNOSIS — Z7901 Long term (current) use of anticoagulants: Secondary | ICD-10-CM

## 2012-11-02 DIAGNOSIS — E1149 Type 2 diabetes mellitus with other diabetic neurological complication: Secondary | ICD-10-CM | POA: Diagnosis present

## 2012-11-02 DIAGNOSIS — IMO0002 Reserved for concepts with insufficient information to code with codable children: Secondary | ICD-10-CM

## 2012-11-02 DIAGNOSIS — E1142 Type 2 diabetes mellitus with diabetic polyneuropathy: Secondary | ICD-10-CM | POA: Diagnosis present

## 2012-11-02 DIAGNOSIS — E785 Hyperlipidemia, unspecified: Secondary | ICD-10-CM

## 2012-11-02 DIAGNOSIS — E1159 Type 2 diabetes mellitus with other circulatory complications: Secondary | ICD-10-CM | POA: Diagnosis present

## 2012-11-02 DIAGNOSIS — K5900 Constipation, unspecified: Secondary | ICD-10-CM | POA: Diagnosis not present

## 2012-11-02 DIAGNOSIS — Z96659 Presence of unspecified artificial knee joint: Secondary | ICD-10-CM

## 2012-11-02 DIAGNOSIS — Z794 Long term (current) use of insulin: Secondary | ICD-10-CM

## 2012-11-02 DIAGNOSIS — E1351 Other specified diabetes mellitus with diabetic peripheral angiopathy without gangrene: Secondary | ICD-10-CM

## 2012-11-02 DIAGNOSIS — E1365 Other specified diabetes mellitus with hyperglycemia: Secondary | ICD-10-CM

## 2012-11-02 HISTORY — PX: TOTAL KNEE ARTHROPLASTY: SHX125

## 2012-11-02 LAB — GLUCOSE, CAPILLARY
Glucose-Capillary: 245 mg/dL — ABNORMAL HIGH (ref 70–99)
Glucose-Capillary: 278 mg/dL — ABNORMAL HIGH (ref 70–99)
Glucose-Capillary: 262 mg/dL — ABNORMAL HIGH (ref 70–99)
Glucose-Capillary: 428 mg/dL — ABNORMAL HIGH (ref 70–99)
Glucose-Capillary: 505 mg/dL — ABNORMAL HIGH (ref 70–99)
Glucose-Capillary: 524 mg/dL — ABNORMAL HIGH (ref 70–99)
Glucose-Capillary: 407 mg/dL — ABNORMAL HIGH (ref 70–99)
Glucose-Capillary: 288 mg/dL — ABNORMAL HIGH (ref 70–99)

## 2012-11-02 LAB — CBC
HCT: 37.2 % (ref 36.0–46.0)
Hemoglobin: 12.7 g/dL (ref 12.0–15.0)
MCH: 30.8 pg (ref 26.0–34.0)
MCHC: 34.1 g/dL (ref 30.0–36.0)
MCV: 90.3 fL (ref 78.0–100.0)
Platelets: 198 10*3/uL (ref 150–400)
RBC: 4.12 MIL/uL (ref 3.87–5.11)
RDW: 13.3 % (ref 11.5–15.5)
WBC: 8 10*3/uL (ref 4.0–10.5)

## 2012-11-02 LAB — CREATININE, SERUM
Creatinine, Ser: 0.61 mg/dL (ref 0.50–1.10)
GFR calc Af Amer: >90 mL/min (ref >90)
GFR calc non Af Amer: 86 mL/min — ABNORMAL LOW (ref >90)

## 2012-11-02 LAB — HEMOGLOBIN A1C
Hgb A1c MFr Bld: 7.7 % — ABNORMAL HIGH (ref <5.7)
Mean Plasma Glucose: 174 mg/dL — ABNORMAL HIGH (ref <117)

## 2012-11-02 LAB — GLUCOSE, RANDOM: Glucose, Bld: 461 mg/dL — ABNORMAL HIGH (ref 70–99)

## 2012-11-02 SURGERY — ARTHROPLASTY, KNEE, TOTAL
Anesthesia: General | Site: Knee | Laterality: Left | Wound class: Clean

## 2012-11-02 MED ORDER — ATORVASTATIN CALCIUM 10 MG PO TABS
10.0000 mg | ORAL_TABLET | Freq: Every day | ORAL | Status: DC
  Administered 2012-11-02 – 2012-11-04 (×3): 10 mg via ORAL
  Filled 2012-11-02 (×4): qty 1

## 2012-11-02 MED ORDER — CEFAZOLIN SODIUM-DEXTROSE 2-3 GM-% IV SOLR
INTRAVENOUS | Status: AC
  Filled 2012-11-02: qty 50

## 2012-11-02 MED ORDER — GABAPENTIN 300 MG PO CAPS
300.0000 mg | ORAL_CAPSULE | Freq: Two times a day (BID) | ORAL | Status: DC
  Administered 2012-11-02 – 2012-11-04 (×6): 300 mg via ORAL
  Filled 2012-11-02 (×8): qty 1

## 2012-11-02 MED ORDER — HYDROMORPHONE HCL PF 1 MG/ML IJ SOLN
INTRAMUSCULAR | Status: AC
  Filled 2012-11-02: qty 1

## 2012-11-02 MED ORDER — DOCUSATE SODIUM 100 MG PO CAPS
100.0000 mg | ORAL_CAPSULE | Freq: Two times a day (BID) | ORAL | Status: DC
  Administered 2012-11-02 – 2012-11-04 (×5): 100 mg via ORAL
  Filled 2012-11-02 (×7): qty 1

## 2012-11-02 MED ORDER — PROPOFOL 10 MG/ML IV BOLUS
INTRAVENOUS | Status: DC | PRN
  Administered 2012-11-02: 130 mg via INTRAVENOUS

## 2012-11-02 MED ORDER — SODIUM CHLORIDE 0.9 % IV SOLN: INTRAVENOUS | Status: DC

## 2012-11-02 MED ORDER — ONDANSETRON HCL 4 MG PO TABS: 4.0000 mg | ORAL_TABLET | Freq: Four times a day (QID) | ORAL | Status: DC | PRN

## 2012-11-02 MED ORDER — INSULIN ASPART 100 UNIT/ML ~~LOC~~ SOLN
6.0000 [IU] | Freq: Once | SUBCUTANEOUS | Status: AC
  Administered 2012-11-02: 6 [IU] via SUBCUTANEOUS

## 2012-11-02 MED ORDER — SENNOSIDES-DOCUSATE SODIUM 8.6-50 MG PO TABS: 1.0000 | ORAL_TABLET | Freq: Every evening | ORAL | Status: DC | PRN

## 2012-11-02 MED ORDER — CHLORHEXIDINE GLUCONATE 4 % EX LIQD: 60.0000 mL | Freq: Once | CUTANEOUS | Status: DC

## 2012-11-02 MED ORDER — ENOXAPARIN SODIUM 30 MG/0.3ML ~~LOC~~ SOLN
30.0000 mg | Freq: Two times a day (BID) | SUBCUTANEOUS | Status: DC
  Administered 2012-11-03 – 2012-11-05 (×5): 30 mg via SUBCUTANEOUS
  Filled 2012-11-02 (×7): qty 0.3

## 2012-11-02 MED ORDER — METOCLOPRAMIDE HCL 5 MG/ML IJ SOLN: 5.0000 mg | Freq: Three times a day (TID) | INTRAMUSCULAR | Status: DC | PRN

## 2012-11-02 MED ORDER — BISACODYL 5 MG PO TBEC: 5.0000 mg | DELAYED_RELEASE_TABLET | Freq: Every day | ORAL | Status: DC | PRN

## 2012-11-02 MED ORDER — PROMETHAZINE HCL 25 MG/ML IJ SOLN: 6.2500 mg | INTRAMUSCULAR | Status: DC | PRN

## 2012-11-02 MED ORDER — SODIUM CHLORIDE 0.9 % IV SOLN
INTRAVENOUS | Status: DC
Start: 2012-11-02 — End: 2012-11-05
  Administered 2012-11-02 – 2012-11-03 (×2): via INTRAVENOUS

## 2012-11-02 MED ORDER — ACETAMINOPHEN 650 MG RE SUPP: 650.0000 mg | Freq: Four times a day (QID) | RECTAL | Status: DC | PRN

## 2012-11-02 MED ORDER — MEPERIDINE HCL 25 MG/ML IJ SOLN: 6.2500 mg | INTRAMUSCULAR | Status: DC | PRN

## 2012-11-02 MED ORDER — ONDANSETRON HCL 4 MG/2ML IJ SOLN
INTRAMUSCULAR | Status: DC | PRN
  Administered 2012-11-02: 4 mg via INTRAVENOUS

## 2012-11-02 MED ORDER — BUPIVACAINE HCL (PF) 0.5 % IJ SOLN
INTRAMUSCULAR | Status: DC | PRN
  Administered 2012-11-02: 30 mL

## 2012-11-02 MED ORDER — METHOCARBAMOL 500 MG PO TABS
500.0000 mg | ORAL_TABLET | Freq: Four times a day (QID) | ORAL | Status: DC | PRN
  Administered 2012-11-03 – 2012-11-05 (×4): 500 mg via ORAL
  Filled 2012-11-02 (×4): qty 1

## 2012-11-02 MED ORDER — CEFAZOLIN SODIUM 1-5 GM-% IV SOLN
INTRAVENOUS | Status: AC
  Filled 2012-11-02: qty 50

## 2012-11-02 MED ORDER — LIDOCAINE HCL (CARDIAC) 20 MG/ML IV SOLN
INTRAVENOUS | Status: DC | PRN
  Administered 2012-11-02: 20 mg via INTRAVENOUS

## 2012-11-02 MED ORDER — INSULIN GLARGINE 100 UNIT/ML ~~LOC~~ SOLN
27.0000 [IU] | Freq: Every day | SUBCUTANEOUS | Status: DC
  Administered 2012-11-02: 27 [IU] via SUBCUTANEOUS
  Filled 2012-11-02 (×2): qty 0.27

## 2012-11-02 MED ORDER — BUPIVACAINE HCL (PF) 0.5 % IJ SOLN
INTRAMUSCULAR | Status: AC
  Filled 2012-11-02: qty 30

## 2012-11-02 MED ORDER — INSULIN ASPART 100 UNIT/ML ~~LOC~~ SOLN: 12.0000 [IU] | Freq: Once | SUBCUTANEOUS | Status: DC

## 2012-11-02 MED ORDER — LACTATED RINGERS IV SOLN
INTRAVENOUS | Status: DC | PRN
  Administered 2012-11-02 (×2): via INTRAVENOUS

## 2012-11-02 MED ORDER — CELECOXIB 200 MG PO CAPS
200.0000 mg | ORAL_CAPSULE | Freq: Two times a day (BID) | ORAL | Status: DC
  Administered 2012-11-02 – 2012-11-04 (×7): 200 mg via ORAL
  Filled 2012-11-02 (×8): qty 1

## 2012-11-02 MED ORDER — MENTHOL 3 MG MT LOZG: 1.0000 | LOZENGE | OROMUCOSAL | Status: DC | PRN

## 2012-11-02 MED ORDER — METHOCARBAMOL 100 MG/ML IJ SOLN
500.0000 mg | Freq: Four times a day (QID) | INTRAMUSCULAR | Status: DC | PRN
  Filled 2012-11-02: qty 5

## 2012-11-02 MED ORDER — CEFAZOLIN SODIUM-DEXTROSE 2-3 GM-% IV SOLR
2.0000 g | Freq: Four times a day (QID) | INTRAVENOUS | Status: AC
  Administered 2012-11-02 (×2): 2 g via INTRAVENOUS
  Filled 2012-11-02 (×2): qty 50

## 2012-11-02 MED ORDER — MIDAZOLAM HCL 2 MG/2ML IJ SOLN: 0.5000 mg | Freq: Once | INTRAMUSCULAR | Status: DC | PRN

## 2012-11-02 MED ORDER — IRBESARTAN 75 MG PO TABS
75.0000 mg | ORAL_TABLET | Freq: Every day | ORAL | Status: DC
  Administered 2012-11-02 – 2012-11-04 (×3): 75 mg via ORAL
  Filled 2012-11-02 (×4): qty 1

## 2012-11-02 MED ORDER — SCOPOLAMINE 1 MG/3DAYS TD PT72
1.0000 | MEDICATED_PATCH | TRANSDERMAL | Status: DC
  Administered 2012-11-02: 1.5 mg via TRANSDERMAL
  Filled 2012-11-02 (×2): qty 1

## 2012-11-02 MED ORDER — FLEET ENEMA 7-19 GM/118ML RE ENEM: 1.0000 | ENEMA | Freq: Once | RECTAL | Status: AC | PRN

## 2012-11-02 MED ORDER — ACETAMINOPHEN 10 MG/ML IV SOLN
INTRAVENOUS | Status: AC
  Filled 2012-11-02: qty 100

## 2012-11-02 MED ORDER — SCOPOLAMINE 1 MG/3DAYS TD PT72
MEDICATED_PATCH | TRANSDERMAL | Status: AC
  Administered 2012-11-02: 1 via TRANSDERMAL
  Filled 2012-11-02: qty 1

## 2012-11-02 MED ORDER — HYDROMORPHONE HCL PF 1 MG/ML IJ SOLN
INTRAMUSCULAR | Status: DC | PRN
  Administered 2012-11-02 (×2): 0.5 mg via INTRAVENOUS

## 2012-11-02 MED ORDER — INSULIN ASPART 100 UNIT/ML ~~LOC~~ SOLN: 5.0000 [IU] | Freq: Two times a day (BID) | SUBCUTANEOUS | Status: DC

## 2012-11-02 MED ORDER — TRANEXAMIC ACID 100 MG/ML IV SOLN
1000.0000 mg | INTRAVENOUS | Status: AC
  Administered 2012-11-02: 1000 mg via INTRAVENOUS
  Filled 2012-11-02: qty 10

## 2012-11-02 MED ORDER — HYDROMORPHONE HCL PF 1 MG/ML IJ SOLN: 0.2500 mg | INTRAMUSCULAR | Status: DC | PRN

## 2012-11-02 MED ORDER — ACETAMINOPHEN 10 MG/ML IV SOLN
1000.0000 mg | Freq: Once | INTRAVENOUS | Status: AC
  Administered 2012-11-02: 1000 mg via INTRAVENOUS

## 2012-11-02 MED ORDER — PHENOL 1.4 % MT LIQD: 1.0000 | OROMUCOSAL | Status: DC | PRN

## 2012-11-02 MED ORDER — EPHEDRINE SULFATE 50 MG/ML IJ SOLN
INTRAMUSCULAR | Status: DC | PRN
  Administered 2012-11-02: 10 mg via INTRAVENOUS
  Administered 2012-11-02: 5 mg via INTRAVENOUS
  Administered 2012-11-02: 10 mg via INTRAVENOUS

## 2012-11-02 MED ORDER — ZOLPIDEM TARTRATE 5 MG PO TABS: 5.0000 mg | ORAL_TABLET | Freq: Every evening | ORAL | Status: DC | PRN

## 2012-11-02 MED ORDER — OXYCODONE HCL 5 MG PO TABS
5.0000 mg | ORAL_TABLET | ORAL | Status: DC | PRN
  Administered 2012-11-02: 5 mg via ORAL
  Administered 2012-11-03: 10 mg via ORAL
  Administered 2012-11-03: 5 mg via ORAL
  Administered 2012-11-04 – 2012-11-05 (×4): 10 mg via ORAL
  Filled 2012-11-02 (×2): qty 2
  Filled 2012-11-02: qty 1
  Filled 2012-11-02 (×2): qty 2
  Filled 2012-11-02: qty 1
  Filled 2012-11-02: qty 2

## 2012-11-02 MED ORDER — INSULIN ASPART 100 UNIT/ML ~~LOC~~ SOLN
0.0000 [IU] | Freq: Three times a day (TID) | SUBCUTANEOUS | Status: DC
  Administered 2012-11-02: 12 [IU] via SUBCUTANEOUS
  Administered 2012-11-03 (×2): 8 [IU] via SUBCUTANEOUS
  Administered 2012-11-03: 11 [IU] via SUBCUTANEOUS
  Administered 2012-11-04 – 2012-11-05 (×4): 8 [IU] via SUBCUTANEOUS

## 2012-11-02 MED ORDER — METOCLOPRAMIDE HCL 10 MG PO TABS: 5.0000 mg | ORAL_TABLET | Freq: Three times a day (TID) | ORAL | Status: DC | PRN

## 2012-11-02 MED ORDER — SODIUM CHLORIDE 0.9 % IJ SOLN
INTRAMUSCULAR | Status: DC | PRN
  Administered 2012-11-02: 08:00:00

## 2012-11-02 MED ORDER — METFORMIN HCL 500 MG PO TABS
500.0000 mg | ORAL_TABLET | Freq: Two times a day (BID) | ORAL | Status: DC
  Filled 2012-11-02 (×2): qty 1

## 2012-11-02 MED ORDER — OXYCODONE HCL ER 10 MG PO T12A
10.0000 mg | EXTENDED_RELEASE_TABLET | Freq: Two times a day (BID) | ORAL | Status: DC
  Administered 2012-11-02: 10 mg via ORAL
  Filled 2012-11-02: qty 1

## 2012-11-02 MED ORDER — HYDROMORPHONE HCL PF 1 MG/ML IJ SOLN
1.0000 mg | INTRAMUSCULAR | Status: DC | PRN
  Administered 2012-11-02: 1 mg via INTRAVENOUS
  Filled 2012-11-02: qty 1

## 2012-11-02 MED ORDER — OXYCODONE HCL 5 MG/5ML PO SOLN: 5.0000 mg | Freq: Once | ORAL | Status: DC | PRN

## 2012-11-02 MED ORDER — ALUM & MAG HYDROXIDE-SIMETH 200-200-20 MG/5ML PO SUSP: 30.0000 mL | ORAL | Status: DC | PRN

## 2012-11-02 MED ORDER — DIPHENHYDRAMINE HCL 12.5 MG/5ML PO ELIX: 12.5000 mg | ORAL_SOLUTION | ORAL | Status: DC | PRN

## 2012-11-02 MED ORDER — INSULIN GLARGINE 100 UNIT/ML ~~LOC~~ SOLN
18.0000 [IU] | Freq: Every day | SUBCUTANEOUS | Status: DC
  Filled 2012-11-02: qty 0.18

## 2012-11-02 MED ORDER — LORATADINE 10 MG PO TABS
10.0000 mg | ORAL_TABLET | Freq: Every day | ORAL | Status: DC
  Administered 2012-11-02 – 2012-11-04 (×3): 10 mg via ORAL
  Filled 2012-11-02 (×4): qty 1

## 2012-11-02 MED ORDER — ONDANSETRON HCL 4 MG/2ML IJ SOLN: 4.0000 mg | Freq: Four times a day (QID) | INTRAMUSCULAR | Status: DC | PRN

## 2012-11-02 MED ORDER — ACETAMINOPHEN 325 MG PO TABS: 650.0000 mg | ORAL_TABLET | Freq: Four times a day (QID) | ORAL | Status: DC | PRN

## 2012-11-02 MED ORDER — OXYCODONE HCL 5 MG PO TABS: 5.0000 mg | ORAL_TABLET | Freq: Once | ORAL | Status: DC | PRN

## 2012-11-02 MED ORDER — FENTANYL CITRATE 0.05 MG/ML IJ SOLN
INTRAMUSCULAR | Status: DC | PRN
  Administered 2012-11-02: 50 ug via INTRAVENOUS
  Administered 2012-11-02: 100 ug via INTRAVENOUS
  Administered 2012-11-02 (×2): 50 ug via INTRAVENOUS

## 2012-11-02 MED ORDER — BUPIVACAINE LIPOSOME 1.3 % IJ SUSP
20.0000 mL | Freq: Once | INTRAMUSCULAR | Status: DC
  Filled 2012-11-02: qty 20

## 2012-11-02 MED ORDER — SODIUM CHLORIDE 0.9 % IR SOLN
Status: DC | PRN
  Administered 2012-11-02: 3000 mL

## 2012-11-02 SURGICAL SUPPLY — 55 items
BANDAGE ESMARK 6X9 LF (GAUZE/BANDAGES/DRESSINGS) ×1 IMPLANT
BLADE SAGITTAL 13X1.27X60 (BLADE) ×2 IMPLANT
BLADE SAW SGTL 83.5X18.5 (BLADE) ×2 IMPLANT
BNDG ESMARK 6X9 LF (GAUZE/BANDAGES/DRESSINGS) ×2
BOWL SMART MIX CTS (DISPOSABLE) ×2 IMPLANT
CATH KIT ON Q 5IN SLV (PAIN MANAGEMENT) ×2 IMPLANT
CEMENT BONE SIMPLEX SPEEDSET (Cement) ×4 IMPLANT
CLOTH BEACON ORANGE TIMEOUT ST (SAFETY) ×2 IMPLANT
COVER SURGICAL LIGHT HANDLE (MISCELLANEOUS) ×2 IMPLANT
CUFF TOURNIQUET SINGLE 34IN LL (TOURNIQUET CUFF) ×2 IMPLANT
DRAPE EXTREMITY T 121X128X90 (DRAPE) ×2 IMPLANT
DRAPE INCISE IOBAN 66X45 STRL (DRAPES) ×4 IMPLANT
DRAPE PROXIMA HALF (DRAPES) ×2 IMPLANT
DRAPE U-SHAPE 47X51 STRL (DRAPES) ×2 IMPLANT
DRSG ADAPTIC 3X8 NADH LF (GAUZE/BANDAGES/DRESSINGS) ×2 IMPLANT
DRSG PAD ABDOMINAL 8X10 ST (GAUZE/BANDAGES/DRESSINGS) ×2 IMPLANT
DURAPREP 26ML APPLICATOR (WOUND CARE) ×4 IMPLANT
ELECT REM PT RETURN 9FT ADLT (ELECTROSURGICAL) ×2
ELECTRODE REM PT RTRN 9FT ADLT (ELECTROSURGICAL) ×1 IMPLANT
EVACUATOR 1/8 PVC DRAIN (DRAIN) ×2 IMPLANT
GLOVE BIOGEL M 7.0 STRL (GLOVE) IMPLANT
GLOVE BIOGEL PI IND STRL 7.5 (GLOVE) IMPLANT
GLOVE BIOGEL PI IND STRL 8.5 (GLOVE) ×1 IMPLANT
GLOVE BIOGEL PI INDICATOR 7.5 (GLOVE)
GLOVE BIOGEL PI INDICATOR 8.5 (GLOVE) ×1
GLOVE BIOGEL PI ORTHO PRO SZ8 (GLOVE) ×1
GLOVE PI ORTHO PRO STRL SZ8 (GLOVE) ×1 IMPLANT
GLOVE SURG ORTHO 8.0 STRL STRW (GLOVE) ×4 IMPLANT
GOWN PREVENTION PLUS XLARGE (GOWN DISPOSABLE) ×4 IMPLANT
GOWN STRL NON-REIN LRG LVL3 (GOWN DISPOSABLE) ×4 IMPLANT
HANDPIECE INTERPULSE COAX TIP (DISPOSABLE) ×1
HOOD PEEL AWAY FACE SHEILD DIS (HOOD) ×8 IMPLANT
KIT BASIN OR (CUSTOM PROCEDURE TRAY) ×2 IMPLANT
KIT ROOM TURNOVER OR (KITS) ×2 IMPLANT
MANIFOLD NEPTUNE II (INSTRUMENTS) ×2 IMPLANT
NEEDLE 22X1 1/2 (OR ONLY) (NEEDLE) IMPLANT
NS IRRIG 1000ML POUR BTL (IV SOLUTION) ×2 IMPLANT
PACK TOTAL JOINT (CUSTOM PROCEDURE TRAY) ×2 IMPLANT
PAD ARMBOARD 7.5X6 YLW CONV (MISCELLANEOUS) ×4 IMPLANT
PADDING CAST COTTON 6X4 STRL (CAST SUPPLIES) ×2 IMPLANT
SET HNDPC FAN SPRY TIP SCT (DISPOSABLE) ×1 IMPLANT
SPONGE GAUZE 4X4 12PLY (GAUZE/BANDAGES/DRESSINGS) ×2 IMPLANT
STAPLER VISISTAT 35W (STAPLE) ×2 IMPLANT
SUCTION FRAZIER TIP 10 FR DISP (SUCTIONS) ×2 IMPLANT
SUT BONE WAX W31G (SUTURE) ×2 IMPLANT
SUT VIC AB 0 CTB1 27 (SUTURE) ×4 IMPLANT
SUT VIC AB 1 CT1 27 (SUTURE) ×2
SUT VIC AB 1 CT1 27XBRD ANBCTR (SUTURE) ×2 IMPLANT
SUT VIC AB 2-0 CT1 27 (SUTURE) ×2
SUT VIC AB 2-0 CT1 TAPERPNT 27 (SUTURE) ×2 IMPLANT
SYR CONTROL 10ML LL (SYRINGE) IMPLANT
TOWEL OR 17X24 6PK STRL BLUE (TOWEL DISPOSABLE) ×2 IMPLANT
TOWEL OR 17X26 10 PK STRL BLUE (TOWEL DISPOSABLE) ×2 IMPLANT
TRAY FOLEY CATH 14FR (SET/KITS/TRAYS/PACK) ×2 IMPLANT
WATER STERILE IRR 1000ML POUR (IV SOLUTION) ×4 IMPLANT

## 2012-11-02 NOTE — Anesthesia Postprocedure Evaluation (Signed)
  Anesthesia Post-op Note  Patient: Kelly Weaver  Procedure(s) Performed: Procedure(s): TOTAL KNEE ARTHROPLASTY (Left)  Patient Location: PACU  Anesthesia Type:General  Level of Consciousness: awake, alert , oriented and patient cooperative  Airway and Oxygen Therapy: Patient Spontanous Breathing and Patient connected to nasal cannula oxygen  Post-op Pain: none  Post-op Assessment: Post-op Vital signs reviewed, Patient's Cardiovascular Status Stable, Respiratory Function Stable, Patent Airway, No signs of Nausea or vomiting and Pain level controlled  Post-op Vital Signs: Reviewed and stable  Complications: No apparent anesthesia complications

## 2012-11-02 NOTE — Transfer of Care (Signed)
Immediate Anesthesia Transfer of Care Note  Patient: Kelly Weaver  Procedure(s) Performed: Procedure(s): TOTAL KNEE ARTHROPLASTY (Left)  Patient Location: PACU  Anesthesia Type:General  Level of Consciousness: awake, alert  and oriented  Airway & Oxygen Therapy: Patient Spontanous Breathing and Patient connected to nasal cannula oxygen  Post-op Assessment: Report given to PACU RN and Post -op Vital signs reviewed and stable  Post vital signs: Reviewed and stable  Complications: No apparent anesthesia complications

## 2012-11-02 NOTE — Progress Notes (Signed)
1815 cbg 528 Dr Gloriann Loan ordered 12 units of insulin 6 more units of insulin given,1900 cbg 505 Dr aware to get another cbg in 1 hr and call results ti Dr.

## 2012-11-02 NOTE — Progress Notes (Signed)
Report given to Diane RN

## 2012-11-02 NOTE — Progress Notes (Signed)
Orthopedic Tech Progress Note Patient Details:  Kelly Weaver Nov 19, 1933 [id number] Applied CPM LLE.  Left Footsie Roll with pt.'s nurse. CPM Left Knee CPM Left Knee: On Left Knee Flexion (Degrees): 90 Left Knee Extension (Degrees): 0   Lesle Chris 11/02/2012, 10:25 AM
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
## 2012-11-02 NOTE — Progress Notes (Signed)
Pts cbg [phone] Altames[address] Cabal PA called was going to call in medical consult stat glucose lab ordered

## 2012-11-02 NOTE — Progress Notes (Signed)
Utilization review completed.  

## 2012-11-02 NOTE — Anesthesia Preprocedure Evaluation (Addendum)
Anesthesia Evaluation  Patient identified by MRN, date of birth, ID band Patient awake    Reviewed: Allergy & Precautions, H&P , NPO status , Patient's Chart, lab work & pertinent test results  History of Anesthesia Complications (+) PONV  Airway Mallampati: II TM Distance: >3 FB Neck ROM: Full    Dental  (+) Teeth Intact and Dental Advisory Given   Pulmonary neg pulmonary ROS,  breath sounds clear to auscultation  Pulmonary exam normal       Cardiovascular hypertension, Pt. on medications + Peripheral Vascular Disease Rhythm:Regular Rate:Normal     Neuro/Psych negative neurological ROS     GI/Hepatic negative GI ROS, Neg liver ROS,   Endo/Other  diabetes (glu 245), Type 2, Insulin Dependent and Oral Hypoglycemic AgentsMorbid obesity  Renal/GU negative Renal ROS     Musculoskeletal   Abdominal (+) + obese,   Peds  Hematology   Anesthesia Other Findings   Reproductive/Obstetrics                          Anesthesia Physical Anesthesia Plan  ASA: II  Anesthesia Plan: General   Post-op Pain Management:    Induction: Intravenous  Airway Management Planned: LMA  Additional Equipment:   Intra-op Plan:   Post-operative Plan: Extubation in OR  Informed Consent: I have reviewed the patients History and Physical, chart, labs and discussed the procedure including the risks, benefits and alternatives for the proposed anesthesia with the patient or authorized representative who has indicated his/her understanding and acceptance.   Dental advisory given  Plan Discussed with: Anesthesiologist, Surgeon and CRNA  Anesthesia Plan Comments: (Plan routine monitors, GA- LMA OK Surgeon prefers Exparel for post op analgesia, declines femoral nerve block  )       Anesthesia Quick Evaluation

## 2012-11-02 NOTE — Evaluation (Addendum)
Physical Therapy Evaluation Patient Details Name: Kelly Weaver MRN: [id number] DOB: 1933-06-15 Today's Date: 11/02/2012 Time: 7829-5621 PT Time Calculation (min): 30 min  PT Assessment / Plan / Recommendation Clinical Impression  Pt is a 79 y.o. female s/p L TKA POD#0. Pt presents with mobility deficits, decreased ROM and strength in L knee. Will benefit from skilled PT to maximize functional mobility and ensure safe transition home to HHPT with husband.     PT Assessment  Patient needs continued PT services    Follow Up Recommendations  Home health PT;Supervision - Intermittent;Supervision for mobility/OOB    Does the patient have the potential to tolerate intense rehabilitation      Barriers to Discharge None      Equipment Recommendations  None recommended by PT (pt reports having 3 in 1, RW and shower bench )    Recommendations for Other Services OT consult   Frequency 7X/week    Precautions / Restrictions Precautions Precautions: Knee;Fall Required Braces or Orthoses: Other Brace/Splint Other Brace/Splint: footsie roll when not amb or in CPM Restrictions Weight Bearing Restrictions: Yes LLE Weight Bearing: Weight bearing as tolerated   Pertinent Vitals/Pain 7/10 with activity; 0/10 at rest; pt repositioned in chair with footsie roll.      Mobility  Bed Mobility Bed Mobility: Supine to Sit;Sitting - Scoot to Edge of Bed Supine to Sit: 4: Min assist;HOB elevated;With rails Sitting - Scoot to Edge of Bed: 4: Min assist;With rail Details for Bed Mobility Assistance: (A) to advance L LE to/off EOB; verbal cues for hand placement and sequencing; pt on RA O2 at 100% with bed mobility  Transfers Transfers: Stand Pivot Transfers;Stand to Sit;Sit to Stand Sit to Stand: 3: Mod assist;From elevated surface;From bed;With upper extremity assist Stand to Sit: 3: Mod assist;With armrests;With upper extremity assist;To chair/3-in-1 Stand Pivot Transfers: 3: Mod assist;From  elevated surface;With armrests Details for Transfer Assistance: (A) due to pain and L LE buckling; unable to WB fully through L LE due to pain; required verbal and tactile cues for hand placement, sequencing and safety with RW; required (A) to steady and to pivot to chair  Ambulation/Gait Ambulation/Gait Assistance: Not tested (comment) (L LE buckling) Stairs: No Wheelchair Mobility Wheelchair Mobility: No    Exercises Total Joint Exercises Ankle Circles/Pumps: AROM;Both;10 reps;Supine   PT Diagnosis: Difficulty walking;Acute pain  PT Problem List: Decreased strength;Decreased range of motion;Decreased balance;Decreased mobility;Decreased knowledge of use of DME;Pain PT Treatment Interventions: DME instruction;Gait training;Functional mobility training;Therapeutic activities;Therapeutic exercise;Balance training;Neuromuscular re-education;Patient/family education   PT Goals Acute Rehab PT Goals PT Goal Formulation: With patient Time For Goal Achievement: 11/06/12 Potential to Achieve Goals: Good Pt will go Supine/Side to Sit: with modified independence PT Goal: Supine/Side to Sit - Progress: Goal set today Pt will go Sit to Supine/Side: with modified independence PT Goal: Sit to Supine/Side - Progress: Goal set today Pt will go Sit to Stand: with modified independence PT Goal: Sit to Stand - Progress: Goal set today Pt will go Stand to Sit: with modified independence PT Goal: Stand to Sit - Progress: Goal set today Pt will Ambulate: >150 feet;with modified independence;with rolling walker PT Goal: Ambulate - Progress: Goal set today Pt will Perform Home Exercise Program: Independently PT Goal: Perform Home Exercise Program - Progress: Goal set today  Visit Information  Last PT Received On: 11/02/12 Assistance Needed: +1    Subjective Data  Subjective: pt lying supine; eager to change positions and get OOB Patient Stated Goal: home with husband  Prior Functioning  Home  Living Lives With: Spouse Available Help at Discharge: Family;Available 24 hours/day Type of Home: House Home Access: Level entry Home Layout: One level Bathroom Shower/Tub: Tub/shower unit;Walk-in shower Bathroom Toilet: Standard Bathroom Accessibility: Yes How Accessible: Accessible via walker Home Adaptive Equipment: Bedside commode/3-in-1;Grab bars in shower;Shower chair with back;Walker - rolling;Straight cane Prior Function Level of Independence: Independent Able to Take Stairs?: Yes Driving: Yes Vocation: Retired Musician: No difficulties Dominant Hand: Right    Cognition  Cognition Arousal/Alertness: Awake/alert Behavior During Therapy: WFL for tasks assessed/performed Overall Cognitive Status: Within Functional Limits for tasks assessed    Extremity/Trunk Assessment Right Lower Extremity Assessment RLE ROM/Strength/Tone: WFL for tasks assessed RLE Sensation: WFL - Light Touch;History of peripheral neuropathy Left Lower Extremity Assessment LLE ROM/Strength/Tone: Deficits;Due to precautions;Due to pain;Unable to fully assess LLE ROM/Strength/Tone Deficits: ankle DF/PF WFL; knee unable to assess due to pain; AROM in sitting up to 74 degrees  LLE Sensation: WFL - Light Touch;History of peripheral neuropathy Trunk Assessment Trunk Assessment: Normal   Balance Balance Balance Assessed: Yes Static Sitting Balance Static Sitting - Balance Support: No upper extremity supported;Feet supported Static Sitting - Level of Assistance: 5: Stand by assistance Static Sitting - Comment/# of Minutes: tolerated sitting EOB ~5 min; no c/o chest pain or dizziness   End of Session PT - End of Session Equipment Utilized During Treatment: Gait belt Activity Tolerance: Patient tolerated treatment well Patient left: in chair;with call bell/phone within reach;with family/visitor present Nurse Communication: Mobility status CPM Left Knee CPM Left Knee: Off (Simultaneous  filing. User may not have seen previous data.) Left Knee Flexion (Degrees): 90 Left Knee Extension (Degrees): 0  GP     Shelva Majestic Enders, [place] [phone] 11/02/2012, 2:47 PM

## 2012-11-02 NOTE — Anesthesia Procedure Notes (Signed)
Procedure Name: LMA Insertion Date/Time: 11/02/2012 7:35 AM Performed by: Arlice Colt B Pre-anesthesia Checklist: Patient identified, Emergency Drugs available, Suction available, Patient being monitored and Timeout performed Patient Re-evaluated:Patient Re-evaluated prior to inductionOxygen Delivery Method: Circle system utilized Intubation Type: IV induction LMA: LMA inserted LMA Size: 4.0 Number of attempts: 1 Placement Confirmation: positive ETCO2 and breath sounds checked- equal and bilateral Tube secured with: Tape Dental Injury: Teeth and Oropharynx as per pre-operative assessment
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
## 2012-11-03 ENCOUNTER — Encounter (HOSPITAL_COMMUNITY): Payer: Self-pay | Admitting: Orthopedic Surgery

## 2012-11-03 DIAGNOSIS — E785 Hyperlipidemia, unspecified: Secondary | ICD-10-CM

## 2012-11-03 HISTORY — PX: TOTAL KNEE ARTHROPLASTY: SHX125

## 2012-11-03 LAB — BASIC METABOLIC PANEL
BUN: 12 mg/dL (ref 6–23)
CO2: 27 mEq/L (ref 19–32)
Calcium: 8.7 mg/dL (ref 8.4–10.5)
Chloride: 98 mEq/L (ref 96–112)
Creatinine, Ser: 0.63 mg/dL (ref 0.50–1.10)
GFR calc Af Amer: >90 mL/min (ref >90)
GFR calc non Af Amer: 85 mL/min — ABNORMAL LOW (ref >90)
Glucose, Bld: 241 mg/dL — ABNORMAL HIGH (ref 70–99)
Potassium: 4.5 mEq/L (ref 3.5–5.1)
Sodium: 132 mEq/L — ABNORMAL LOW (ref 135–145)

## 2012-11-03 LAB — CBC
HCT: 33.5 % — ABNORMAL LOW (ref 36.0–46.0)
Hemoglobin: 11.6 g/dL — ABNORMAL LOW (ref 12.0–15.0)
MCH: 30.6 pg (ref 26.0–34.0)
MCHC: 34.6 g/dL (ref 30.0–36.0)
MCV: 88.4 fL (ref 78.0–100.0)
Platelets: 221 10*3/uL (ref 150–400)
RBC: 3.79 MIL/uL — ABNORMAL LOW (ref 3.87–5.11)
RDW: 13.3 % (ref 11.5–15.5)
WBC: 8 10*3/uL (ref 4.0–10.5)

## 2012-11-03 LAB — GLUCOSE, CAPILLARY
Glucose-Capillary: 212 mg/dL — ABNORMAL HIGH (ref 70–99)
Glucose-Capillary: 239 mg/dL — ABNORMAL HIGH (ref 70–99)
Glucose-Capillary: 252 mg/dL — ABNORMAL HIGH (ref 70–99)
Glucose-Capillary: 252 mg/dL — ABNORMAL HIGH (ref 70–99)
Glucose-Capillary: 311 mg/dL — ABNORMAL HIGH (ref 70–99)
Glucose-Capillary: 298 mg/dL — ABNORMAL HIGH (ref 70–99)
Glucose-Capillary: 380 mg/dL — ABNORMAL HIGH (ref 70–99)

## 2012-11-03 MED ORDER — INSULIN GLARGINE 100 UNIT/ML ~~LOC~~ SOLN
35.0000 [IU] | Freq: Every day | SUBCUTANEOUS | Status: DC
  Administered 2012-11-03: 35 [IU] via SUBCUTANEOUS
  Filled 2012-11-03 (×2): qty 0.35

## 2012-11-03 MED ORDER — ENOXAPARIN SODIUM 40 MG/0.4ML ~~LOC~~ SOLN: 40.0000 mg | SUBCUTANEOUS | Status: DC

## 2012-11-03 MED ORDER — METFORMIN HCL 500 MG PO TABS
1000.0000 mg | ORAL_TABLET | Freq: Two times a day (BID) | ORAL | Status: DC
  Administered 2012-11-04 – 2012-11-05 (×3): 1000 mg via ORAL
  Filled 2012-11-03 (×5): qty 2

## 2012-11-03 MED ORDER — METHOCARBAMOL 500 MG PO TABS: 500.0000 mg | ORAL_TABLET | Freq: Four times a day (QID) | ORAL | Status: DC | PRN

## 2012-11-03 MED ORDER — OXYCODONE HCL 5 MG PO TABS: 5.0000 mg | ORAL_TABLET | ORAL | Status: DC | PRN

## 2012-11-03 NOTE — Progress Notes (Signed)
Physical Therapy Treatment Patient Details Name: Kelly Weaver MRN: [id number] DOB: January 18, 1934 Today's Date: 11/03/2012 Time: 1229-1255 PT Time Calculation (min): 26 min  PT Assessment / Plan / Recommendation Comments on Treatment Session  Pt cooperative but extremely impulsive and with great difficulty following cues and carrying over information    Follow Up Recommendations  Home health PT;Supervision - Intermittent;Supervision for mobility/OOB     Does the patient have the potential to tolerate intense rehabilitation     Barriers to Discharge        Equipment Recommendations  None recommended by PT    Recommendations for Other Services OT consult  Frequency 7X/week   Plan Discharge plan remains appropriate;Other (comment) (consider SNF if confusion continues and spouse unable 24/7)    Precautions / Restrictions Precautions Precautions: Knee;Fall Required Braces or Orthoses: Other Brace/Splint Other Brace/Splint: footsie roll when not amb or in CPM Restrictions Weight Bearing Restrictions: No LLE Weight Bearing: Weight bearing as tolerated   Pertinent Vitals/Pain     Mobility  Bed Mobility Bed Mobility: Supine to Sit;Sit to Supine Supine to Sit: 4: Min assist Sitting - Scoot to Edge of Bed: 4: Min assist Details for Bed Mobility Assistance: ++ cues for sequence, use of R LE and use of UEs to self assist. Physical assist to manage L LE and to bring trunk to upright position Transfers Transfers: Sit to Stand;Stand to Sit Sit to Stand: 4: Min assist;3: Mod assist Stand to Sit: 4: Min assist;3: Mod assist Details for Transfer Assistance: ++ verbal and tactile cues for LE management and use of UEs  Ambulation/Gait Ambulation/Gait Assistance: 1: +2 Total assist Ambulation/Gait: Patient Percentage: 70% Ambulation Distance (Feet): 64 Feet Assistive device: Rolling walker Ambulation/Gait Assistance Details: constant cues for sequence, stride length, position from RW,  posture and BOS Gait Pattern: Wide base of support;Narrow base of support;Trunk flexed;Antalgic;Decreased stance time - left;Step-to pattern General Gait Details: Pt with difficultly following cues and with ltd carry over, repeating errors multiple times throughout session Stairs: No    Exercises     PT Diagnosis:    PT Problem List:   PT Treatment Interventions:     PT Goals Acute Rehab PT Goals PT Goal Formulation: With patient Time For Goal Achievement: 11/06/12 Potential to Achieve Goals: Good Pt will go Supine/Side to Sit: with modified independence PT Goal: Supine/Side to Sit - Progress: Progressing toward goal Pt will go Sit to Supine/Side: with modified independence PT Goal: Sit to Supine/Side - Progress: Progressing toward goal Pt will go Sit to Stand: with modified independence PT Goal: Sit to Stand - Progress: Progressing toward goal Pt will go Stand to Sit: with modified independence PT Goal: Stand to Sit - Progress: Progressing toward goal Pt will Ambulate: >150 feet;with modified independence;with rolling walker PT Goal: Ambulate - Progress: Progressing toward goal Pt will Perform Home Exercise Program: Independently  Visit Information  Last PT Received On: 11/03/12 Assistance Needed: +2 (+2 saftey - pt impulsive and easily confused)    Subjective Data  Subjective: I need to go to the bathroom; I don't need to go to the bathroom, I just went. Patient Stated Goal: home with husband    Cognition  Cognition Arousal/Alertness: Awake/alert Behavior During Therapy: Impulsive Overall Cognitive Status: Impaired/Different from baseline Area of Impairment: Following commands Following Commands: Follows one step commands inconsistently    Balance     End of Session PT - End of Session Equipment Utilized During Treatment: Gait belt Activity Tolerance: Patient tolerated treatment  well Patient left: in bed;in CPM;with call bell/phone within reach Nurse Communication:  Mobility status CPM Left Knee CPM Left Knee: On Left Knee Flexion (Degrees): 60 Left Knee Extension (Degrees): 0   GP     Clarance Bollard 11/03/2012, 4:06 PM

## 2012-11-03 NOTE — Progress Notes (Signed)
[facility] PROGRESS NOTE  Kelly Weaver YNW:[id number] DOB: [birth year] DOA: 11/02/2012 PCP: Kirstie Peri, MD  HPI/Subjective: Patient sees to be confused about her diabetic regimen. Patient uses 3 different types of insulin at home.  Assessment/Plan:   Diabetes mellitus type 2 Hemoglobin A1c is 7.7, correlate with a mean plasma glucose of 174. Recommend to be discharged on Lantus insulin and NovoLog per meal coverage. We will try to titrate Lantus insulin further. Her renal function seems to be okay, she will benefit from adding metformin.  On discharge please discontinue the 75/25 insulin mix   Procedures:  Left total knee replacement  Antibiotics:  Perioperative antibiotics only    Objective: Filed Vitals:   11/02/12 1953 11/03/12 0045 11/03/12 0605 11/03/12 1440  BP: 112/60 122/76 118/64 119/45  Pulse: 75 80 71 74  Temp: 98.4 F (36.9 C) 98.6 F (37 C) 98.7 F (37.1 C) 98.7 F (37.1 C)  TempSrc: Oral  Oral   Resp: 18 18 18 18   SpO2: 97% 96% 98% 98%    Intake/Output Summary (Last 24 hours) at 11/03/12 1820 Last data filed at 11/03/12 1610  Gross per 24 hour  Intake  727.5 ml  Output   2785 ml  Net -2057.5 ml   There were no vitals filed for this visit.  Exam:  General: Alert and awake, oriented x3, not in any acute distress. HEENT: anicteric sclera, pupils reactive to light and accommodation, EOMI CVS: S1-S2 clear, no murmur rubs or gallops Chest: clear to auscultation bilaterally, no wheezing, rales or rhonchi Abdomen: soft nontender, nondistended, normal bowel sounds, no organomegaly Extremities: no cyanosis, clubbing or edema noted bilaterally Neuro: Cranial nerves II-XII intact, no focal neurological deficits  Data Reviewed: Basic Metabolic Panel:  Recent Labs Lab 11/02/12 1600 11/03/12 0519  NA  --  132*  K  --  4.5  CL  --  98  CO2  --  27  GLUCOSE 461* 241*  BUN  --  12  CREATININE 0.61 0.63  CALCIUM  --  8.7   Liver  Function Tests: No results found for this basename: AST, ALT, ALKPHOS, BILITOT, PROT, ALBUMIN,  in the last 168 hours No results found for this basename: LIPASE, AMYLASE,  in the last 168 hours No results found for this basename: AMMONIA,  in the last 168 hours CBC:  Recent Labs Lab 11/02/12 1600 11/03/12 0519  WBC 8.0 8.0  HGB 12.7 11.6*  HCT 37.2 33.5*  MCV 90.3 88.4  PLT 198 221   Cardiac Enzymes: No results found for this basename: CKTOTAL, CKMB, CKMBINDEX, TROPONINI,  in the last 168 hours BNP (last 3 results) No results found for this basename: PROBNP,  in the last 8760 hours CBG:  Recent Labs Lab 11/03/12 0337 11/03/12 0557 11/03/12 0656 11/03/12 1121 11/03/12 1613  GLUCAP 239* 252* 252* 311* 298*    No results found for this or any previous visit (from the past 240 hour(s)).   Studies: No results found.  Scheduled Meds: . atorvastatin  10 mg Oral q1800  . celecoxib  200 mg Oral Q12H  . docusate sodium  100 mg Oral BID  . enoxaparin (LOVENOX) injection  30 mg Subcutaneous Q12H  . gabapentin  300 mg Oral BID  . insulin aspart  0-15 Units Subcutaneous TID WC  . insulin glargine  27 Units Subcutaneous QHS  . irbesartan  75 mg Oral Daily  . loratadine  10 mg Oral Daily  . scopolamine  1 patch Transdermal Q72H  Continuous Infusions: . sodium chloride 75 mL/hr at 11/03/12 1036    Principal Problem:   History of total knee arthroplasty Active Problems:   DM (diabetes mellitus), secondary, uncontrolled, periph vascul complic   HTN (hypertension)   HLD (hyperlipidemia)    Time spent: 35 minutes    [facility]  [facility] Pager [phone]. If 7PM-7AM, please contact night-coverage at [email or web address], password [facility] 11/03/2012, 6:20 PM  LOS: 1 day

## 2012-11-03 NOTE — Progress Notes (Signed)
Inpatient Diabetes Program Recommendations  AACE/ADA: New Consensus Statement on Inpatient Glycemic Control (2013)  Target Ranges:  Prepandial:   less than 140 mg/dL      Peak postprandial:   less than 180 mg/dL (1-2 hours)      Critically ill patients:  140 - 180 mg/dL  Results for Kelly Weaver, Kelly Weaver (MRN [id number]) as of 11/03/2012 14:39  Ref. Range 11/03/2012 01:40 11/03/2012 03:37 11/03/2012 05:57 11/03/2012 06:56 11/03/2012 11:21  Glucose-Capillary Latest Range: 70-99 mg/dL 528 (H) 413 (H) 244 (H) 252 (H) 311 (H)   Inpatient Diabetes Program Recommendations Insulin - Basal: Increase Lantus to 35 units  Thank you  Piedad Climes BSN, RN,CDE Inpatient Diabetes Coordinator [phone] (team pager)

## 2012-11-03 NOTE — Progress Notes (Signed)
Physical Therapy Treatment Patient Details Name: Kelly Weaver MRN: [id number] DOB: 1934-03-06 Today's Date: 11/03/2012 Time: 1015-1100 PT Time Calculation (min): 45 min  PT Assessment / Plan / Recommendation Comments on Treatment Session  Pt cooperative but extremely impulsive and with great difficulty following cues and carrying over information    Follow Up Recommendations  Home health PT;Supervision - Intermittent;Supervision for mobility/OOB     Does the patient have the potential to tolerate intense rehabilitation     Barriers to Discharge        Equipment Recommendations  None recommended by PT    Recommendations for Other Services OT consult  Frequency 7X/week   Plan Discharge plan remains appropriate    Precautions / Restrictions Precautions Precautions: Knee;Fall Required Braces or Orthoses: Other Brace/Splint Other Brace/Splint: footsie roll when not amb or in CPM Restrictions Weight Bearing Restrictions: No LLE Weight Bearing: Weight bearing as tolerated   Pertinent Vitals/Pain 5/10; premed, ice packs provided    Mobility  Bed Mobility Bed Mobility: Supine to Sit Supine to Sit: 3: Mod assist Details for Bed Mobility Assistance: ++ cues for sequence, use of R LE and use of UEs to self assist. Physical assist to manage L LE and to bring trunk to upright position Transfers Transfers: Sit to Stand;Stand to Sit Sit to Stand: 3: Mod assist;With upper extremity assist;From chair/3-in-1;From bed;With armrests;From elevated surface Stand to Sit: 3: Mod assist;With armrests;To chair/3-in-1;With upper extremity assist Details for Transfer Assistance: ++ verbal and tactile cues for LE management and use of UEs  Ambulation/Gait Ambulation/Gait Assistance: 1: +2 Total assist Ambulation/Gait: Patient Percentage: 70% Ambulation Distance (Feet): 12 Feet Assistive device: Rolling walker Ambulation/Gait Assistance Details: constant cues for sequence, position from RW,  stride length, posture and saftey awareness. Gait Pattern: Step-to pattern;Decreased step length - right;Decreased step length - left;Decreased stance time - left General Gait Details: Pt very impulsive and with limited saftey awareness Stairs: No Wheelchair Mobility Wheelchair Mobility: No    Exercises Total Joint Exercises Ankle Circles/Pumps: AROM;Both;10 reps;Supine;AAROM Quad Sets: AAROM;10 reps;Supine;Both Heel Slides: AAROM;15 reps;Supine;Left Straight Leg Raises: AAROM;10 reps;Supine;Left Goniometric ROM: -10 - 50 AAROM    PT Diagnosis:    PT Problem List:   PT Treatment Interventions:     PT Goals Acute Rehab PT Goals PT Goal Formulation: With patient Time For Goal Achievement: 11/06/12 Potential to Achieve Goals: Good Pt will go Supine/Side to Sit: with modified independence PT Goal: Supine/Side to Sit - Progress: Progressing toward goal Pt will go Sit to Supine/Side: with modified independence PT Goal: Sit to Supine/Side - Progress: Progressing toward goal Pt will go Sit to Stand: with modified independence PT Goal: Sit to Stand - Progress: Progressing toward goal Pt will go Stand to Sit: with modified independence PT Goal: Stand to Sit - Progress: Progressing toward goal Pt will Ambulate: >150 feet;with modified independence;with rolling walker PT Goal: Ambulate - Progress: Progressing toward goal Pt will Perform Home Exercise Program: Independently PT Goal: Perform Home Exercise Program - Progress: Progressing toward goal  Visit Information  Last PT Received On: 11/03/12 Assistance Needed: +2 (2* pt confusion, impulsiveness and difficulty following cues)    Subjective Data  Subjective: I think I have been in this machine long enough Patient Stated Goal: home with husband    Cognition  Cognition Arousal/Alertness: Awake/alert Behavior During Therapy: Impulsive Overall Cognitive Status: Impaired/Different from baseline Area of Impairment: Following  commands Following Commands: Follows one step commands inconsistently    Balance  End of Session PT - End of Session Equipment Utilized During Treatment: Gait belt Activity Tolerance: Patient tolerated treatment well Patient left: in chair;with call bell/phone within reach Nurse Communication: Mobility status CPM Left Knee CPM Left Knee: Off   GP     Chrisann Melaragno 11/03/2012, 12:23 PM

## 2012-11-03 NOTE — Care Management Note (Signed)
CARE MANAGEMENT NOTE 11/03/2012  Patient:  Kelly Weaver, Kelly Weaver   Account Number:  [id number]  Date Initiated:  11/02/2012  Documentation initiated by:  Vance Peper  Subjective/Objective Assessment:   79 yr old female s/p left total knee arthroplasty.     Action/Plan:   Patient preoperatively setup with [facility]. Choice offered. No changes.Patient has rolling walker and 3in1, CPM to be delivered to the home.   Anticipated DC Date:  11/05/2012   Anticipated DC Plan:  HOME W HOME HEALTH SERVICES      DC Planning Services  CM consult      [facility] Choice  HOME HEALTH   Choice offered to / List presented to:  C-1 Patient      DME agency  [facility]        [facility] agency  [facility]   Status of service:  Completed, signed off Medicare Important Message given?   (If response is "NO", the following Medicare IM given date fields will be blank) Date Medicare IM given:   Date Additional Medicare IM given:    Discharge Disposition:  HOME W HOME HEALTH SERVICES  Per UR Regulation:    If discussed at Long Length of Stay Meetings, dates discussed:    Comments:

## 2012-11-03 NOTE — Progress Notes (Signed)
SPORTS MEDICINE AND JOINT REPLACEMENT  Georgena Spurling, MD   Altamese Cabal, PA-C [address] [place], [place], [place]  [address]                             [phone]   PROGRESS NOTE  Subjective:  negative for Chest Pain  negative for Shortness of Breath  negative for Nausea/Vomiting   negative for Calf Pain  negative for Bowel Movement   Tolerating Diet: yes         Patient reports pain as 4 on 0-10 scale.    Objective: Vital signs in last 24 hours:   Patient Vitals for the past 24 hrs:  BP Temp Temp src Pulse Resp SpO2  11/03/12 1440 119/45 mmHg 98.7 F (37.1 C) - 74 18 98 %  11/03/12 0605 118/64 mmHg 98.7 F (37.1 C) Oral 71 18 98 %  11/03/12 0045 122/76 mmHg 98.6 F (37 C) - 80 18 96 %  11/02/12 1953 112/60 mmHg 98.4 F (36.9 C) Oral 75 18 97 %    @flow {1959:LAST@   Intake/Output from previous day:   06/02 0701 - 06/03 0700 In: 2267.5 [P.O.:720; I.V.:1547.5] Out: 3335 [Urine:3050; Drains:235]   Intake/Output this shift:       Intake/Output     06/03 0701 - 06/04 0700   P.O. 240   I.V.    Total Intake 240   Urine 700   Drains    Blood    Total Output 700   Net -460          LABORATORY DATA:  Recent Labs  11/02/12 1600 11/03/12 0519  WBC 8.0 8.0  HGB 12.7 11.6*  HCT 37.2 33.5*  PLT 198 221    Recent Labs  11/02/12 1600 11/03/12 0519  NA  --  132*  K  --  4.5  CL  --  98  CO2  --  27  BUN  --  12  CREATININE 0.61 0.63  GLUCOSE 461* 241*  CALCIUM  --  8.7   Lab Results  Component Value Date   INR 0.89 10/23/2012   INR 0.90 07/04/2010    Examination:  General appearance: alert, cooperative and no distress Extremities: Homans sign is negative, no sign of DVT  Wound Exam: clean, dry, intact   Drainage:  None: wound tissue dry  Motor Exam: EHL and FHL Intact  Sensory Exam: Deep Peroneal normal   Assessment:    1 Day Post-Op  Procedure(s) (LRB): TOTAL KNEE ARTHROPLASTY (Left)  ADDITIONAL DIAGNOSIS:  Principal  Problem:   History of total knee arthroplasty Active Problems:   DM (diabetes mellitus), secondary, uncontrolled, periph vascul complic   HTN (hypertension)   HLD (hyperlipidemia)  Acute Blood Loss Anemia   Plan: Physical Therapy as ordered Weight Bearing as Tolerated (WBAT)  DVT Prophylaxis:  Lovenox  DISCHARGE PLAN: Home  DISCHARGE NEEDS: HHPT, CPM, Walker and 3-in-1 comode seat         Librado Guandique 11/03/2012, 7:18 PM

## 2012-11-04 ENCOUNTER — Encounter (HOSPITAL_COMMUNITY): Payer: Self-pay | Admitting: General Practice

## 2012-11-04 DIAGNOSIS — Z96659 Presence of unspecified artificial knee joint: Secondary | ICD-10-CM

## 2012-11-04 LAB — BASIC METABOLIC PANEL
BUN: 16 mg/dL (ref 6–23)
CO2: 29 mEq/L (ref 19–32)
Calcium: 8.8 mg/dL (ref 8.4–10.5)
Chloride: 103 mEq/L (ref 96–112)
Creatinine, Ser: 0.74 mg/dL (ref 0.50–1.10)
GFR calc Af Amer: >90 mL/min (ref >90)
GFR calc non Af Amer: 81 mL/min — ABNORMAL LOW (ref >90)
Glucose, Bld: 326 mg/dL — ABNORMAL HIGH (ref 70–99)
Potassium: 4.2 mEq/L (ref 3.5–5.1)
Sodium: 137 mEq/L (ref 135–145)

## 2012-11-04 LAB — GLUCOSE, CAPILLARY
Glucose-Capillary: 293 mg/dL — ABNORMAL HIGH (ref 70–99)
Glucose-Capillary: 274 mg/dL — ABNORMAL HIGH (ref 70–99)
Glucose-Capillary: 271 mg/dL — ABNORMAL HIGH (ref 70–99)
Glucose-Capillary: 180 mg/dL — ABNORMAL HIGH (ref 70–99)

## 2012-11-04 LAB — CBC
HCT: 33.1 % — ABNORMAL LOW (ref 36.0–46.0)
Hemoglobin: 11.3 g/dL — ABNORMAL LOW (ref 12.0–15.0)
MCH: 30.6 pg (ref 26.0–34.0)
MCHC: 34.1 g/dL (ref 30.0–36.0)
MCV: 89.7 fL (ref 78.0–100.0)
Platelets: 202 10*3/uL (ref 150–400)
RBC: 3.69 MIL/uL — ABNORMAL LOW (ref 3.87–5.11)
RDW: 13.4 % (ref 11.5–15.5)
WBC: 5.1 10*3/uL (ref 4.0–10.5)

## 2012-11-04 MED ORDER — INSULIN GLARGINE 100 UNIT/ML ~~LOC~~ SOLN: 28.0000 [IU] | Freq: Two times a day (BID) | SUBCUTANEOUS | Status: DC

## 2012-11-04 MED ORDER — INSULIN GLARGINE 100 UNIT/ML ~~LOC~~ SOLN: 35.0000 [IU] | Freq: Two times a day (BID) | SUBCUTANEOUS | Status: DC

## 2012-11-04 MED ORDER — POLYETHYLENE GLYCOL 3350 17 G PO PACK
17.0000 g | PACK | Freq: Every day | ORAL | Status: DC
  Administered 2012-11-04: 17 g via ORAL
  Filled 2012-11-04 (×2): qty 1

## 2012-11-04 MED ORDER — METFORMIN HCL 1000 MG PO TABS: 1000.0000 mg | ORAL_TABLET | Freq: Two times a day (BID) | ORAL | Status: DC

## 2012-11-04 MED ORDER — INSULIN ASPART 100 UNIT/ML ~~LOC~~ SOLN
5.0000 [IU] | Freq: Three times a day (TID) | SUBCUTANEOUS | Status: DC
  Administered 2012-11-04: 5 [IU] via SUBCUTANEOUS

## 2012-11-04 MED ORDER — POLYETHYLENE GLYCOL 3350 17 G PO PACK: 17.0000 g | PACK | Freq: Every day | ORAL | Status: DC

## 2012-11-04 MED ORDER — INSULIN ASPART 100 UNIT/ML ~~LOC~~ SOLN: 5.0000 [IU] | Freq: Three times a day (TID) | SUBCUTANEOUS | Status: DC

## 2012-11-04 MED ORDER — INSULIN GLARGINE 100 UNIT/ML ~~LOC~~ SOLN
28.0000 [IU] | Freq: Two times a day (BID) | SUBCUTANEOUS | Status: DC
  Filled 2012-11-04 (×2): qty 0.28

## 2012-11-04 NOTE — Progress Notes (Signed)
Orthopedic Tech Progress Note Patient Details:  Kelly Weaver 05/02/1934 [id number] Applied overhead frame.     Jennye Moccasin 11/04/2012, 6:37 PM

## 2012-11-04 NOTE — Progress Notes (Signed)
[facility] PROGRESS NOTE  Kelly Weaver ZOX:[id number] DOB: 04-Mar-1934 DOA: 11/02/2012 PCP: Kelly Weaver  Assessment/Plan: 1-Left total knee replacement: per ortho service  2-constipation: will add miralax daily; due to pain meds  3-HTN: stable. Continue current meds  4-Diabetes type: on chronic insulin. CBG's still reaching 300 range. Will change lantus to 28 units BID; continue metformin 1000mg  BID and add TID meal coverage; this will most likey control her CBG's and still will be easier and simpler than home regimen.  5-HLD: continue lipitor  Code Status: Full Family Communication: husband at bedside Disposition Plan: per primary service; will follow along for further adjustments on her glycemic medications while inpatient.   Procedures:  Left total knee replacement  HPI/Subjective: Afebrile; denies CP, SOB, abdominal pain or any other complaints.  Objective: Filed Vitals:   11/03/12 2000 11/03/12 2218 11/04/12 0654 11/04/12 1417  BP:  142/60 138/57 98/78  Pulse:  88 72 69  Temp:  94 F (34.4 C) 98.3 F (36.8 C) 98.3 F (36.8 C)  TempSrc:      Resp: 18 18 18 18   SpO2: 98% 99% 100% 96%    Intake/Output Summary (Last 24 hours) at 11/04/12 1640 Last data filed at 11/04/12 1300  Gross per 24 hour  Intake    480 ml  Output    900 ml  Net   -420 ml   There were no vitals filed for this visit.  Exam:   General:  NAD, afebrile; cooperative with exam  Cardiovascular: S1 and S2, no rubs or gallops  Respiratory: CTA bilaterally  Abdomen: soft, NT, ND, positive BS  Musculoskeletal: trace edema bilaterally; exerciser device on left knee  Data Reviewed: Basic Metabolic Panel:  Recent Labs Lab 11/02/12 1600 11/03/12 0519 11/04/12 0547  NA  --  132* 137  K  --  4.5 4.2  CL  --  98 103  CO2  --  27 29  GLUCOSE 461* 241* 326*  BUN  --  12 16  CREATININE 0.61 0.63 0.74  CALCIUM  --  8.7 8.8   CBC:  Recent Labs Lab 11/02/12 1600  11/03/12 0519 11/04/12 0547  WBC 8.0 8.0 5.1  HGB 12.7 11.6* 11.3*  HCT 37.2 33.5* 33.1*  MCV 90.3 88.4 89.7  PLT 198 221 202   CBG:  Recent Labs Lab 11/03/12 1613 11/03/12 2215 11/04/12 0656 11/04/12 1125 11/04/12 1611  GLUCAP 298* 380* 293* 274* 271*     Studies: No results found.  Scheduled Meds: . atorvastatin  10 mg Oral q1800  . celecoxib  200 mg Oral Q12H  . docusate sodium  100 mg Oral BID  . enoxaparin (LOVENOX) injection  30 mg Subcutaneous Q12H  . gabapentin  300 mg Oral BID  . insulin aspart  0-15 Units Subcutaneous TID WC  . [START ON 11/05/2012] insulin glargine  28 Units Subcutaneous BID  . irbesartan  75 mg Oral Daily  . loratadine  10 mg Oral Daily  . metFORMIN  1,000 mg Oral BID WC  . scopolamine  1 patch Transdermal Q72H   Continuous Infusions: . sodium chloride 75 mL/hr at 11/03/12 1036    Principal Problem:   History of total knee arthroplasty Active Problems:   DM (diabetes mellitus), secondary, uncontrolled, periph vascul complic   HTN (hypertension)   HLD (hyperlipidemia)    Time spent: >30 minutes    Kelly Weaver  [facility] Pager [phone]. If 7PM-7AM, please contact night-coverage at [email or web address], password [facility] 11/04/2012, 4:40 PM  LOS:  2 days

## 2012-11-04 NOTE — Progress Notes (Signed)
Inpatient Diabetes Program Recommendations  AACE/ADA: New Consensus Statement on Inpatient Glycemic Control (2013)  Target Ranges:  Prepandial:   less than 140 mg/dL      Peak postprandial:   less than 180 mg/dL (1-2 hours)      Critically ill patients:  140 - 180 mg/dL  Results for Kelly Weaver, Kelly Weaver (MRN [id number]) as of 11/04/2012 14:28  Ref. Range 11/04/2012 06:56 11/04/2012 11:25  Glucose-Capillary Latest Range: 70-99 mg/dL 308 (H) 657 (H)   Inpatient Diabetes Program Recommendations Insulin - Basal: Increase Lantus to 40 units  Consider adding Novolog 5 units TID with meals during hospital stay Thank you  Piedad Climes BSN, RN,CDE Inpatient Diabetes Coordinator [phone] (team pager)

## 2012-11-04 NOTE — Progress Notes (Signed)
Physical Therapy Treatment Patient Details Name: Kelly Weaver MRN: [id number] DOB: 1933-06-22 Today's Date: 11/04/2012 Time: 7829-5621 PT Time Calculation (min): 25 min  PT Assessment / Plan / Recommendation Comments on Treatment Session  Pt slowly progressing with therapy. Cont to demo decreased safety awareness and judgement. Pt and husband are not agreeable to SNF; hope to D/C home today or tomorrow. Will cont to f/u with pt to maximize functional mobility and ensure safe transition home to HHPT.    Follow Up Recommendations  Home health PT;Supervision - Intermittent;Supervision for mobility/OOB     Does the patient have the potential to tolerate intense rehabilitation     Barriers to Discharge        Equipment Recommendations  None recommended by PT    Recommendations for Other Services    Frequency 7X/week   Plan Discharge plan remains appropriate;Frequency remains appropriate    Precautions / Restrictions Precautions Precautions: Knee;Fall Required Braces or Orthoses: Other Brace/Splint Other Brace/Splint: footsie roll when not amb or in CPM Restrictions Weight Bearing Restrictions: No LLE Weight Bearing: Weight bearing as tolerated   Pertinent Vitals/Pain 5/10; pt states no pain when walking. In CPM; set 0 to 45degree per pt and husband request. Encouraged pt and husband to increase ROM slowly    Mobility  Bed Mobility Bed Mobility: Sit to Supine Sit to Supine: 4: Min guard Details for Bed Mobility Assistance: (A) to guide L LE onto bed; pt can be more indpendent with encouragement and requires verbal cues for sequencing and handrails due to pain  Transfers Transfers: Sit to Stand;Stand to Sit Sit to Stand: 4: Min guard;From chair/3-in-1;With upper extremity assist;With armrests Stand to Sit: 4: Min guard;To bed;To chair/3-in-1;With upper extremity assist Details for Transfer Assistance: verbal cues constantly for safety; pt tends to flop where sitting and pulls up  and back on RW; demo decreased safety awareness and is impulsive with transfers  Ambulation/Gait Ambulation/Gait Assistance: 4: Min guard Ambulation Distance (Feet): 100 Feet Assistive device: Rolling walker Ambulation/Gait Assistance Details: constant cues for safety with the RW; tends to pick up RW when manuevering it; will leave it occasionaly demo decreased safety awareness and judgement; multimodal cues for gt sequencing and upright posture   Gait Pattern: Wide base of support;Narrow base of support;Trunk flexed;Antalgic;Decreased stance time - left;Step-to pattern Gait velocity: decreased due to pain  General Gait Details: Pt with difficultly following cues and with ltd carry over, repeating errors multiple times throughout session Stairs: Yes Stairs Assistance: 4: Min assist Stairs Assistance Details (indicate cue type and reason): multimodal cues for safety and gt sequencing; pt demo lack of safety awareness  Stair Management Technique: No rails;Forwards;With walker;Step to pattern Number of Stairs: 1 Wheelchair Mobility Wheelchair Mobility: No    Exercises Total Joint Exercises Ankle Circles/Pumps: AROM;Both;10 reps;Supine;AAROM Heel Slides: AAROM;15 reps;Supine;Left Hip ABduction/ADduction: AAROM;Left;10 reps;Seated Goniometric ROM: -4 to 50 degrees; limited by pain; AROM in supine   PT Diagnosis:    PT Problem List:   PT Treatment Interventions:     PT Goals Acute Rehab PT Goals PT Goal Formulation: With patient Time For Goal Achievement: 11/06/12 Potential to Achieve Goals: Good PT Goal: Sit to Supine/Side - Progress: Progressing toward goal PT Goal: Sit to Stand - Progress: Progressing toward goal PT Goal: Stand to Sit - Progress: Progressing toward goal PT Goal: Ambulate - Progress: Progressing toward goal PT Goal: Up/Down Stairs - Progress: Progressing toward goal PT Goal: Perform Home Exercise Program - Progress: Progressing toward goal  Visit  Information  Last  PT Received On: 11/04/12 Assistance Needed: +1    Subjective Data  Subjective: pt sitting in chair with husband present; "i want to go to the bathroom then we can walk down the hall"  Patient Stated Goal: home with husband    Cognition  Cognition Arousal/Alertness: Awake/alert Behavior During Therapy: Impulsive Overall Cognitive Status: Impaired/Different from baseline Area of Impairment: Attention Current Attention Level: Selective General Comments: requires redirecting and constant cues for task sequencing     Balance  Balance Balance Assessed: No  End of Session PT - End of Session Equipment Utilized During Treatment: Gait belt Activity Tolerance: Patient tolerated treatment well Patient left: in CPM;in bed;with family/visitor present Nurse Communication: Mobility status   GP     Donell Sievert, [place] [phone] 11/04/2012, 3:49 PM

## 2012-11-04 NOTE — Progress Notes (Signed)
SPORTS MEDICINE AND JOINT REPLACEMENT  Georgena Spurling, MD   Altamese Cabal, PA-C [address] [place], [place], [place]  [address]                             [phone]   PROGRESS NOTE  Subjective:  negative for Chest Pain  negative for Shortness of Breath  negative for Nausea/Vomiting   negative for Calf Pain  negative for Bowel Movement   Tolerating Diet: yes         Patient reports pain as 3 on 0-10 scale.    Objective: Vital signs in last 24 hours:   Patient Vitals for the past 24 hrs:  BP Temp Pulse Resp SpO2  11/04/12 1417 98/78 mmHg 98.3 F (36.8 C) 69 18 96 %  11/04/12 0654 138/57 mmHg 98.3 F (36.8 C) 72 18 100 %  11/03/12 2218 142/60 mmHg 94 F (34.4 C) 88 18 99 %  11/03/12 2000 - - - 18 98 %    @flow {1959:LAST@   Intake/Output from previous day:   06/03 0701 - 06/04 0700 In: 480 [P.O.:480] Out: 1600 [Urine:1600]   Intake/Output this shift:   06/04 0701 - 06/04 1900 In: 240 [P.O.:240] Out: -    Intake/Output     06/03 0701 - 06/04 0700 06/04 0701 - 06/05 0700   P.O. 480 240   I.V.     Total Intake 480 240   Urine 1600    Drains     Blood     Total Output 1600     Net -1120 +240        Urine Occurrence 1 x 2 x      LABORATORY DATA:  Recent Labs  11/02/12 1600 11/03/12 0519 11/04/12 0547  WBC 8.0 8.0 5.1  HGB 12.7 11.6* 11.3*  HCT 37.2 33.5* 33.1*  PLT 198 221 202    Recent Labs  11/02/12 1600 11/03/12 0519 11/04/12 0547  NA  --  132* 137  K  --  4.5 4.2  CL  --  98 103  CO2  --  27 29  BUN  --  12 16  CREATININE 0.61 0.63 0.74  GLUCOSE 461* 241* 326*  CALCIUM  --  8.7 8.8   Lab Results  Component Value Date   INR 0.89 10/23/2012   INR 0.90 07/04/2010    Examination:  General appearance: alert, cooperative and no distress Extremities: Homans sign is negative, no sign of DVT  Wound Exam: clean, dry, intact   Drainage:  None: wound tissue dry  Motor Exam: EHL and FHL Intact  Sensory Exam: Deep Peroneal  normal   Assessment:    2 Days Post-Op  Procedure(s) (LRB): TOTAL KNEE ARTHROPLASTY (Left)  ADDITIONAL DIAGNOSIS:  Principal Problem:   History of total knee arthroplasty Active Problems:   DM (diabetes mellitus), secondary, uncontrolled, periph vascul complic   HTN (hypertension)   HLD (hyperlipidemia)  Acute Blood Loss Anemia   Plan: Physical Therapy as ordered Weight Bearing as Tolerated (WBAT)  DVT Prophylaxis:  Lovenox  DISCHARGE PLAN: Home  DISCHARGE NEEDS: HHPT, CPM, Walker and 3-in-1 comode seat         Kelly Weaver 11/04/2012, 5:22 PM

## 2012-11-04 NOTE — Discharge Summary (Signed)
SPORTS MEDICINE & JOINT REPLACEMENT   Georgena Spurling, MD   Altamese Cabal, PA-C [address] [place], [place], [place]  [address]                             [phone]  PATIENT ID: Kelly Weaver        MRN:  [id number]          DOB/AGE: 01-28-1934 / 79 y.o.    DISCHARGE SUMMARY  ADMISSION DATE:    11/02/2012 DISCHARGE DATE:   11/04/2012   ADMISSION DIAGNOSIS: osteoarthritis left knee    DISCHARGE DIAGNOSIS:  osteoarthritis left knee    ADDITIONAL DIAGNOSIS: Principal Problem:   History of total knee arthroplasty Active Problems:   DM (diabetes mellitus), secondary, uncontrolled, periph vascul complic   HTN (hypertension)   HLD (hyperlipidemia)  Past Medical History  Diagnosis Date  . Diabetes mellitus   . Peripheral vascular disease   . Hyperlipidemia   . Edema   . Thrombophlebitis   . Varicose veins   . Complication of anesthesia   . PONV (postoperative nausea and vomiting)   . Neuropathy   . Arthritis   . DVT (deep venous thrombosis) 2001    left  took Lovenox injections    PROCEDURE: Procedure(s): TOTAL KNEE ARTHROPLASTY on 11/02/2012  CONSULTS:     HISTORY:  See H&P in chart  HOSPITAL COURSE:  AVRYL ROEHM is a 79 y.o. admitted on 11/02/2012 and found to have a diagnosis of osteoarthritis left knee.  After appropriate laboratory studies were obtained  they were taken to the operating room on 11/02/2012 and underwent Procedure(s): TOTAL KNEE ARTHROPLASTY.   They were given perioperative antibiotics:  Anti-infectives   Start     Dose/Rate Route Frequency Ordered Stop   11/02/12 0955  ceFAZolin (ANCEF) 2-3 GM-% IVPB SOLR    Comments:  CITTY, TERESA JO: cabinet override      11/02/12 0955 11/02/12 2159   11/02/12 0954  ceFAZolin (ANCEF) 1-5 GM-% IVPB  Status:  Discontinued    Comments:  CITTY, TERESA JO: cabinet override      11/02/12 0954 11/02/12 0955   11/02/12 0930  ceFAZolin (ANCEF) IVPB 2 g/50 mL premix     2 g 100 mL/hr over 30 Minutes Intravenous  Every 6 hours 11/02/12 0923 11/02/12 2011   11/02/12 0600  ceFAZolin (ANCEF) IVPB 2 g/50 mL premix     2 g 100 mL/hr over 30 Minutes Intravenous On call to O.R. 11/01/12 1319 11/02/12 0732    .  Tolerated the procedure well.  Placed with a foley intraoperatively.  Given Ofirmev at induction and for 48 hours.    POD# 1: Vital signs were stable.  Patient denied Chest pain, shortness of breath, or calf pain.  Patient was started on Lovenox 30 mg subcutaneously twice daily at 8am.  Consults to PT, OT, and care management were made.  The patient was weight bearing as tolerated.  CPM was placed on the operative leg 0-90 degrees for 6-8 hours a day.  Incentive spirometry was taught.  Dressing was changed.  Marcaine pump and hemovac were discontinued.      POD #2, Continued  PT for ambulation and exercise program.  IV saline locked.  O2 discontinued.    The remainder of the hospital course was dedicated to ambulation and strengthening.   The patient was discharged on 2 Days Post-Op in  Good condition.  Blood products given:none  DIAGNOSTIC STUDIES: Recent vital signs: Patient Vitals for the past 24 hrs:  BP Temp Pulse Resp SpO2  11/04/12 1417 98/78 mmHg 98.3 F (36.8 C) 69 18 96 %  11/04/12 0654 138/57 mmHg 98.3 F (36.8 C) 72 18 100 %  11/03/12 2218 142/60 mmHg 94 F (34.4 C) 88 18 99 %  11/03/12 2000 - - - 18 98 %       Recent laboratory studies:  Recent Labs  11/02/12 1600 11/03/12 0519 11/04/12 0547  WBC 8.0 8.0 5.1  HGB 12.7 11.6* 11.3*  HCT 37.2 33.5* 33.1*  PLT 198 221 202    Recent Labs  11/02/12 1600 11/03/12 0519 11/04/12 0547  NA  --  132* 137  K  --  4.5 4.2  CL  --  98 103  CO2  --  27 29  BUN  --  12 16  CREATININE 0.61 0.63 0.74  GLUCOSE 461* 241* 326*  CALCIUM  --  8.7 8.8   Lab Results  Component Value Date   INR 0.89 10/23/2012   INR 0.90 07/04/2010     Recent Radiographic Studies :  Dg Chest 2 View  10/23/2012   *RADIOLOGY REPORT*  Clinical  Data: Preoperative evaluation for left knee replacement. History of hypertension and diabetes.  Nonsmoker  CHEST - 2 VIEW  Comparison: 07/04/2010  Findings: Heart and mediastinal contours are stable with a normal heart size.  The lung fields demonstrate prominence of the interstitial markings compatible with some degree of underlying bronchitic change.  Focal scarring in the right middle lobe and at the costophrenic angles is stable as is a probable fat pad at the right cardiophrenic angle. The lung fields appear clear with no signs of focal infiltrate or congestive failure.  No pleural fluid or significant peribronchial cuffing is seen.  Bony structures appear intact with rod fixation in the left humeral head. Overall decrease in bone density is suspected.  IMPRESSION: Stable cardiopulmonary appearance with no new focal or acute abnormality identified.  Question decreased bone density. This can be fully evaluated with DXA exam if clinically indicated.   Original Report Authenticated By: Rhodia Albright, M.D.    DISCHARGE INSTRUCTIONS: Discharge Orders   Future Orders Complete By Expires     CPM  As directed     Comments:      Continuous passive motion machine (CPM):      Use the CPM from 0 to 90 for 6-8 hours per day.      You may increase by 10 per day.  You may break it up into 2 or 3 sessions per day.      Use CPM for 2 weeks or until you are told to stop.    Call MD / Call 911  As directed     Comments:      If you experience chest pain or shortness of breath, CALL 911 and be transported to the hospital emergency room.  If you develope a fever above 101 F, pus (white drainage) or increased drainage or redness at the wound, or calf pain, call your surgeon's office.    Change dressing  As directed     Comments:      Change dressing on Thursday, then change the dressing daily with sterile 4 x 4 inch gauze dressing and apply TED hose.    Constipation Prevention  As directed     Comments:       Drink plenty of fluids.  Prune juice may be  helpful.  You may use a stool softener, such as Colace (over the counter) 100 mg twice a day.  Use MiraLax (over the counter) for constipation as needed.    Diet - low sodium heart healthy  As directed     Do not put a pillow under the knee. Place it under the heel.  As directed     Driving restrictions  As directed     Comments:      No driving for 6 weeks    Increase activity slowly as tolerated  As directed     Lifting restrictions  As directed     Comments:      No lifting for 6 weeks    TED hose  As directed     Comments:      Use stockings (TED hose) for 3 weeks on both leg(s).  You may remove them at night for sleeping.       DISCHARGE MEDICATIONS:     Medication List    STOP taking these medications       aspirin EC 81 MG tablet     fish oil-omega-3 fatty acids 1000 MG capsule     insulin aspart 100 UNIT/ML injection  Commonly known as:  novoLOG      TAKE these medications       CALCIUM 600+D 600-200 MG-UNIT Tabs  Generic drug:  Calcium Carbonate-Vitamin D  Take 1 tablet by mouth 2 (two) times daily with breakfast and lunch.     enoxaparin 40 MG/0.4ML injection  Commonly known as:  LOVENOX  Inject 0.4 mLs (40 mg total) into the skin daily.     gabapentin 300 MG capsule  Commonly known as:  NEURONTIN  Take 300 mg by mouth 2 (two) times daily.     glucose blood test strip  1 each by Other route as needed. Use as instructed     insulin glargine 100 UNIT/ML injection  Commonly known as:  LANTUS  Inject 18 Units into the skin at bedtime.     insulin lispro protamine-lispro (75-25) 100 UNIT/ML Susp  Commonly known as:  HUMALOG 75/25  Inject 36 Units into the skin daily with breakfast.     loratadine 10 MG tablet  Commonly known as:  CLARITIN  Take 10 mg by mouth daily.     metFORMIN 500 MG tablet  Commonly known as:  GLUCOPHAGE  Take 500 mg by mouth 2 (two) times daily with a meal. Breakfast and dinner.      methocarbamol 500 MG tablet  Commonly known as:  ROBAXIN  Take 1 tablet (500 mg total) by mouth every 6 (six) hours as needed.     oxyCODONE 5 MG immediate release tablet  Commonly known as:  Oxy IR/ROXICODONE  Take 1-2 tablets (5-10 mg total) by mouth every 4 (four) hours as needed.     rosuvastatin 5 MG tablet  Commonly known as:  CRESTOR  Take 5 mg by mouth every evening.     valsartan 40 MG tablet  Commonly known as:  DIOVAN  Take 40 mg by mouth daily with breakfast.        FOLLOW UP VISIT:       Follow-up Information   Follow up with Raymon Mutton, MD. Call on 11/17/2012.   Contact information:   [address] [place] [address] [phone]       DISPOSITION: HOME   CONDITION:  Good   Karielle Davidow 11/04/2012, 5:23 PM

## 2012-11-04 NOTE — Progress Notes (Signed)
Physical Therapy Treatment Patient Details Name: Kelly Weaver MRN: [id number] DOB: 08-Dec-1933 Today's Date: 11/04/2012 Time: 0912-0940 PT Time Calculation (min): 28 min  PT Assessment / Plan / Recommendation Comments on Treatment Session  Pt slowly progressing with therapy. Pt and husband adament about D/C home with husband and HHPT. Pt can be impulsive at times and requires redirection and constant cues for task sequencing. Will plan to attempt step this afternoon. Will cont to f/u with pt to ensure safe transition home with husband and HHPT.     Follow Up Recommendations  Home health PT;Supervision - Intermittent;Supervision for mobility/OOB     Does the patient have the potential to tolerate intense rehabilitation     Barriers to Discharge        Equipment Recommendations  None recommended by PT    Recommendations for Other Services    Frequency 7X/week   Plan Discharge plan remains appropriate;Frequency remains appropriate    Precautions / Restrictions Precautions Precautions: Knee;Fall Required Braces or Orthoses: Other Brace/Splint Other Brace/Splint: footsie roll when not amb or in CPM Restrictions Weight Bearing Restrictions: No LLE Weight Bearing: Weight bearing as tolerated   Pertinent Vitals/Pain 6/10; primarily with movement; pt premedicated.     Mobility  Bed Mobility Bed Mobility: Supine to Sit;Sitting - Scoot to Edge of Bed Supine to Sit: HOB elevated;With rails;4: Min assist Sitting - Scoot to [place] of Bed: 4: Min assist Details for Bed Mobility Assistance: (A) to advance L LE to/off EOB and to control L LE to ground; requires increased time, constant cues and rails for sequencing  Transfers Transfers: Sit to Stand;Stand to Sit Sit to Stand: 4: Min guard;From bed;From elevated surface;With upper extremity assist Stand to Sit: 4: Min guard;To chair/3-in-1;With upper extremity assist Details for Transfer Assistance: multimodal cues for hand placement and  sequencing; pt tries to pull to stand from RW, demo decreased safety awareness, correctable with cues  Ambulation/Gait Ambulation/Gait Assistance: 4: Min guard Ambulation Distance (Feet): 12 Feet (x2) Assistive device: Rolling walker Ambulation/Gait Assistance Details: verbal cues for gt sequencing and safety with RW; pt tends to look at feet while amb, requires constant cues for safety  Gait Pattern: Wide base of support;Narrow base of support;Trunk flexed;Antalgic;Decreased stance time - left;Step-to pattern Gait velocity: decreased due to pain  Stairs: No Wheelchair Mobility Wheelchair Mobility: No    Exercises Total Joint Exercises Ankle Circles/Pumps: AROM;Both;10 reps;Supine;AAROM Quad Sets: AAROM;Left;10 reps;Seated (required tactile cues for proper muscle activation) Hip ABduction/ADduction: AAROM;Left;10 reps;Seated Long Arc Quad: AAROM;Left;10 reps;Seated Knee Flexion: AAROM;Left;5 reps;Seated Goniometric ROM: -6 to 60 degrees AROM in sitting; limited by pain    PT Diagnosis:    PT Problem List:   PT Treatment Interventions:     PT Goals Acute Rehab PT Goals PT Goal Formulation: With patient Time For Goal Achievement: 11/06/12 Potential to Achieve Goals: Good PT Goal: Supine/Side to Sit - Progress: Progressing toward goal PT Goal: Sit to Stand - Progress: Progressing toward goal PT Goal: Stand to Sit - Progress: Progressing toward goal PT Goal: Ambulate - Progress: Progressing toward goal Pt will Go Up / Down Stairs: 1-2 stairs;with supervision;with least restrictive assistive device PT Goal: Up/Down Stairs - Progress: Goal set today PT Goal: Perform Home Exercise Program - Progress: Progressing toward goal  Visit Information  Last PT Received On: 11/04/12 Assistance Needed: +1 PT/OT Co-Evaluation/Treatment: Yes    Subjective Data  Subjective: Pt lying supine in CPM; husband present; agreeable to therapy. " last time i stayed 5 days  then had a bath after 3, thats  probably what i need this time. ive been up and down all night" Patient Stated Goal: home with husband    Cognition  Cognition Arousal/Alertness: Awake/alert Behavior During Therapy: Impulsive Overall Cognitive Status: Impaired/Different from baseline Area of Impairment: Attention Current Attention Level: Selective General Comments: requires redirecting and constant cues for task sequencing     Balance  Balance Balance Assessed: No  End of Session PT - End of Session Equipment Utilized During Treatment: Gait belt Activity Tolerance: Patient tolerated treatment well Patient left: in chair;with call bell/phone within reach;with family/visitor present;with chair alarm set Nurse Communication: Mobility status CPM Left Knee CPM Left Knee: [address]   GP     Donnamarie Poag [place] , [place] [phone]  11/04/2012, 10:04 AM

## 2012-11-04 NOTE — Op Note (Signed)
TOTAL KNEE REPLACEMENT OPERATIVE NOTE:  11/02/2012  5:42 PM  PATIENT:  Kelly Weaver  79 y.o. female  PRE-OPERATIVE DIAGNOSIS:  osteoarthritis left knee  POST-OPERATIVE DIAGNOSIS:  osteoarthritis left knee  PROCEDURE:  Procedure(s): TOTAL KNEE ARTHROPLASTY  SURGEON:  Surgeon(s): Dannielle Huh, MD  PHYSICIAN ASSISTANT: Altamese Cabal, [facility]  ANESTHESIA:   general  DRAINS: Hemovac and On-Q Marcaine Pain Pump  SPECIMEN: None  COUNTS:  Correct  TOURNIQUET:   Total Tourniquet Time Documented: Thigh (Left) - 47 minutes Total: Thigh (Left) - 47 minutes   DICTATION:  Indication for procedure:    The patient is a 79 y.o. female who has failed conservative treatment for osteoarthritis left knee.  Informed consent was obtained prior to anesthesia. The risks versus benefits of the operation were explain and in a way the patient can, and did, understand.   Description of procedure:     The patient was taken to the operating room and placed under anesthesia.  The patient was positioned in the usual fashion taking care that all body parts were adequately padded and/or protected.  I foley catheter was placed.  A tourniquet was applied and the leg prepped and draped in the usual sterile fashion.  The extremity was exsanguinated with the esmarch and tourniquet inflated to 350 mmHg.  Pre-operative range of motion was 0-120 degrees flexion.  The knee was in 5 degree of mild varus.  A midline incision approximately 6-7 inches long was made with a #10 blade.  A new blade was used to make a parapatellar arthrotomy going 2-3 cm into the quadriceps tendon, over the patella, and alongside the medial aspect of the patellar tendon.  A synovectomy was then performed with the #10 blade and forceps. I then elevated the deep MCL off the medial tibial metaphysis subperiosteally around to the semimembranosus attachment.    I everted the patella and used calipers to measure patellar thickness.  I used the  reamer to ream down to appropriate thickness to recreate the native thickness.  I then removed excess bone with the rongeur and sagittal saw.  I used the appropriately sized template and drilled the three lug holes.  I then put the trial in place and measured the thickness with the calipers to ensure recreation of the native thickness.  The trial was then removed and the patella subluxed and the knee brought into flexion.  A homan retractor was place to retract and protect the patella and lateral structures.  A Z-retractor was place medially to protect the medial structures.  The extra-medullary alignment system was used to make cut the tibial articular surface perpendicular to the anamotic axis of the tibia and in 3 degrees of posterior slope.  The cut surface and alignment jig was removed.  I then used the intramedullary alignment guide to make a 6 valgus cut on the distal femur.  I then marked out the epicondylar axis on the distal femur.  The posterior condylar axis measured 3 degrees.  I then used the anterior referencing sizer and measured the femur to be a size F.  The 4-In-1 cutting block was screwed into place in external rotation matching the posterior condylar angle, making our cuts perpendicular to the epicondylar axis.  Anterior, posterior and chamfer cuts were made with the sagittal saw.  The cutting block and cut pieces were removed.  A lamina spreader was placed in 90 degrees of flexion.  The ACL, PCL, menisci, and posterior condylar osteophytes were removed.  A 10 mm  spacer blocked was found to offer good flexion and extension gap balance after minimal in degree releasing.   The scoop retractor was then placed and the femoral finishing block was pinned in place.  The small sagittal saw was used as well as the lug drill to finish the femur.  The block and cut surfaces were removed and the medullary canal hole filled with autograft bone from the cut pieces.  The tibia was delivered forward in  deep flexion and external rotation.  A size 5 tray was selected and pinned into place centered on the medial 1/3 of the tibial tubercle.  The reamer and keel was used to prepare the tibia through the tray.    I then trialed with the size F femur, size 5 tibia, a 10 mm insert and the 35 patella.  I had excellent flexion/extension gap balance, excellent patella tracking.  Flexion was full and beyond 120 degrees; extension was zero.  These components were chosen and the staff opened them to me on the back table while the knee was lavaged copiously and the cement mixed.  I cemented in the components and removed all excess cement.  The polyethylene tibial component was snapped into place and the knee placed in extension while cement was hardening.  The capsule was infilltrated with 20cc of .25% Marcaine with epinephrine.  A hemovac was place in the joint exiting superolaterally.  A pain pump was place superomedially superficial to the arthrotomy.  Once the cement was hard, the tourniquet was let down.  Hemostasis was obtained.  The arthrotomy was closed with figure-8 #1 vicryl sutures.  The deep soft tissues were closed with #0 vicryls and the subcuticular layer closed with a running #2-0 vicryl.  The skin was reapproximated and closed with skin staples.  The wound was dressed with xeroform, 4 x4's, 2 ABD sponges, a single layer of webril and a TED stocking.   The patient was then awakened, extubated, and taken to the recovery room in stable condition.  BLOOD LOSS:  300cc DRAINS: 1 hemovac, 1 pain catheter COMPLICATIONS:  None.  PLAN OF CARE: Admit to inpatient   PATIENT DISPOSITION:  PACU - hemodynamically stable.   Delay start of Pharmacological VTE agent (>24hrs) due to surgical blood loss or risk of bleeding:  not applicable  Please fax a copy of this op note to my office at [phone] (please only include page 1 and 2 of the Case Information op note)

## 2012-11-04 NOTE — Evaluation (Signed)
Occupational Therapy Evaluation Patient Details Name: Kelly Weaver MRN: [id number] DOB: 1934/03/22 Today's Date: 11/04/2012 Time: 3086-5784 OT Time Calculation (min): 25 min  OT Assessment / Plan / Recommendation Clinical Impression    Pt is a 79 y.o. female s/p L TKA. Pt presents with below problem list. Will benefit from skilled OT to maximize independence and ensure safe transition home with husband.      OT Assessment  Patient needs continued OT Services    Follow Up Recommendations  Home health OT;Supervision/Assistance - 24 hour    Barriers to Discharge      Equipment Recommendations  None recommended by OT    Recommendations for Other Services    Frequency  Min 2X/week    Precautions / Restrictions Precautions Precautions: Knee;Fall Required Braces or Orthoses: Other Brace/Splint Other Brace/Splint: footsie roll when not amb or in CPM Restrictions Weight Bearing Restrictions: No LLE Weight Bearing: Weight bearing as tolerated   Pertinent Vitals/Pain Pain 6/10; primarily with movement. Pt premedicated     ADL  Eating/Feeding: Independent Where Assessed - Eating/Feeding: Chair Grooming: Set up Where Assessed - Grooming: Supported sitting Upper Body Bathing: Set up;Supervision/safety Where Assessed - Upper Body Bathing: Supported sitting Lower Body Bathing: Moderate assistance Where Assessed - Lower Body Bathing: Supported sit to stand Upper Body Dressing: Set up;Supervision/safety Where Assessed - Upper Body Dressing: Supported sitting Lower Body Dressing: Moderate assistance Where Assessed - Lower Body Dressing: Supported sit to stand Toilet Transfer: Min Armed forces logistics/support/administrative officer Method: Sit to Barista: Raised toilet seat with arms (or 3-in-1 over toilet) Toileting - Clothing Manipulation and Hygiene: Performed;Min guard;Supervision/safety (hygiene-supervision and clothing-minguard) Where Assessed - Toileting Clothing  Manipulation and Hygiene: Sit on 3-in-1 or toilet;Standing (hygiene-sitting and clothing-standing) Tub/Shower Transfer Method: Not assessed Equipment Used: Gait belt;Rolling walker Transfers/Ambulation Related to ADLs: Minguard ADL Comments: Pt unable to don either sock while sitting in chair. Pt overall Mod A for LB ADLs. Pt performed toilet transfer with Minguard assist.     OT Diagnosis: Acute pain  OT Problem List: Decreased strength;Decreased range of motion;Decreased activity tolerance;Impaired balance (sitting and/or standing);Decreased safety awareness;Decreased knowledge of use of DME or AE;Decreased knowledge of precautions;Pain OT Treatment Interventions: Self-care/ADL training;DME and/or AE instruction;Therapeutic activities;Patient/family education;Balance training   OT Goals Acute Rehab OT Goals OT Goal Formulation: With patient Time For Goal Achievement: 11/11/12 Potential to Achieve Goals: Good ADL Goals Pt Will Perform Grooming: with modified independence;Standing at sink ADL Goal: Grooming - Progress: Goal set today Pt Will Perform Lower Body Bathing: with modified independence;Sit to stand from chair ADL Goal: Lower Body Bathing - Progress: Goal set today Pt Will Perform Lower Body Dressing: with modified independence;Sit to stand from chair;Sit to stand from bed ADL Goal: Lower Body Dressing - Progress: Goal set today Pt Will Transfer to Toilet: with modified independence;Ambulation;with DME ADL Goal: Toilet Transfer - Progress: Goal set today Pt Will Perform Toileting - Clothing Manipulation: with modified independence;Standing ADL Goal: Toileting - Clothing Manipulation - Progress: Goal set today Pt Will Perform Toileting - Hygiene: with modified independence;Sit to stand from 3-in-1/toilet;Sitting on 3-in-1 or toilet ADL Goal: Toileting - Hygiene - Progress: Goal set today Pt Will Perform Tub/Shower Transfer: Tub transfer;Shower transfer;with  supervision;Ambulation;with DME;Other (comment) (pt unsure which they are going to use) ADL Goal: Tub/Shower Transfer - Progress: Goal set today  Visit Information  Last OT Received On: 11/04/12 Assistance Needed: +1 PT/OT Co-Evaluation/Treatment: Yes    Subjective Data      Prior  Functioning     Home Living Lives With: Spouse Available Help at Discharge: Family;Available 24 hours/day Type of Home: House Home Access: Stairs to enter [employer] of Steps: 1 Home Layout: One level Bathroom Shower/Tub: Tub/shower unit;Walk-in shower Bathroom Toilet: Standard Bathroom Accessibility: Yes How Accessible: Accessible via walker Home Adaptive Equipment: Bedside commode/3-in-1;Grab bars in shower;Shower chair with back;Walker - rolling;Straight cane Prior Function Level of Independence: Independent Able to Take Stairs?: Yes Driving: Yes Vocation: Retired Musician: No difficulties Dominant Hand: Right         Vision/Perception     Cognition  Cognition Arousal/Alertness: Awake/alert Behavior During Therapy: Impulsive Overall Cognitive Status: Impaired/Different from baseline Area of Impairment: Attention Current Attention Level: Selective General Comments: requires redirecting and constant cues for task sequencing     Extremity/Trunk Assessment Right Upper Extremity Assessment RUE ROM/Strength/Tone: WFL for tasks assessed Left Upper Extremity Assessment LUE ROM/Strength/Tone: WFL for tasks assessed     Mobility Bed Mobility Bed Mobility: Supine to Sit;Sitting - Scoot to Edge of Bed Supine to Sit: HOB elevated;With rails;4: Min assist Sitting - Scoot to [place] of Bed: 4: Min assist Details for Bed Mobility Assistance: (A) to advance L LE to/off EOB and to control L LE to ground; requires increased time, constant cues and rails for sequencing  Transfers Transfers: Sit to Stand;Stand to Sit Sit to Stand: 4: Min guard;With upper extremity  assist;From bed;From elevated surface;From chair/3-in-1 Stand to Sit: 4: Min guard;With upper extremity assist;To chair/3-in-1 Details for Transfer Assistance: multimodal cues for hand placement and sequencing; pt tries to pull to stand from RW, demo decreased safety awareness, correctable with cues            End of Session OT - End of Session Equipment Utilized During Treatment: Gait belt Activity Tolerance: Patient tolerated treatment well Patient left: in chair;Other (comment) (with PT) CPM Left Knee CPM Left Knee: Off  GO     Earlie Raveling OTR/L [phone] 11/04/2012, 11:43 AM

## 2012-11-04 NOTE — Progress Notes (Addendum)
Pt is ready for Discharge unable to print discharge papers due to meds not being reconciled.Paged Altamese Cabal PA stated he was not near a computer to leave pt until tomorrow Dr Gwenlyn Perking paged.Reconciled meds pt and husband refused to go. pt back to bed wanted to wait until am.

## 2012-11-05 LAB — CBC
HCT: 32.2 % — ABNORMAL LOW (ref 36.0–46.0)
Hemoglobin: 11 g/dL — ABNORMAL LOW (ref 12.0–15.0)
MCH: 30.2 pg (ref 26.0–34.0)
MCHC: 34.2 g/dL (ref 30.0–36.0)
MCV: 88.5 fL (ref 78.0–100.0)
Platelets: 207 10*3/uL (ref 150–400)
RBC: 3.64 MIL/uL — ABNORMAL LOW (ref 3.87–5.11)
RDW: 13.3 % (ref 11.5–15.5)
WBC: 5.6 10*3/uL (ref 4.0–10.5)

## 2012-11-05 LAB — BASIC METABOLIC PANEL
BUN: 17 mg/dL (ref 6–23)
CO2: 26 mEq/L (ref 19–32)
Calcium: 8.5 mg/dL (ref 8.4–10.5)
Chloride: 99 mEq/L (ref 96–112)
Creatinine, Ser: 0.67 mg/dL (ref 0.50–1.10)
GFR calc Af Amer: >90 mL/min (ref >90)
GFR calc non Af Amer: 83 mL/min — ABNORMAL LOW (ref >90)
Glucose, Bld: 343 mg/dL — ABNORMAL HIGH (ref 70–99)
Potassium: 4.5 mEq/L (ref 3.5–5.1)
Sodium: 133 mEq/L — ABNORMAL LOW (ref 135–145)

## 2012-11-05 LAB — GLUCOSE, CAPILLARY: Glucose-Capillary: 290 mg/dL — ABNORMAL HIGH (ref 70–99)

## 2013-02-09 ENCOUNTER — Other Ambulatory Visit: Payer: Self-pay | Admitting: *Deleted

## 2013-02-09 NOTE — Telephone Encounter (Signed)
Not imc pt 

## 2013-03-01 DIAGNOSIS — N3020 Other chronic cystitis without hematuria: Secondary | ICD-10-CM | POA: Insufficient documentation

## 2013-03-01 DIAGNOSIS — R109 Unspecified abdominal pain: Secondary | ICD-10-CM | POA: Insufficient documentation

## 2013-03-19 MED ORDER — BUPIVACAINE LIPOSOME 1.3 % IJ SUSP: 20.0000 mL | Freq: Once | INTRAMUSCULAR | Status: DC

## 2013-03-26 DIAGNOSIS — H2510 Age-related nuclear cataract, unspecified eye: Secondary | ICD-10-CM | POA: Insufficient documentation

## 2013-04-14 DIAGNOSIS — Z9842 Cataract extraction status, left eye: Secondary | ICD-10-CM | POA: Insufficient documentation

## 2013-04-22 DIAGNOSIS — H35359 Cystoid macular degeneration, unspecified eye: Secondary | ICD-10-CM | POA: Insufficient documentation

## 2013-04-22 DIAGNOSIS — H40031 Anatomical narrow angle, right eye: Secondary | ICD-10-CM | POA: Insufficient documentation

## 2013-04-30 DIAGNOSIS — R351 Nocturia: Secondary | ICD-10-CM | POA: Insufficient documentation

## 2013-05-24 DIAGNOSIS — H35379 Puckering of macula, unspecified eye: Secondary | ICD-10-CM | POA: Insufficient documentation

## 2013-05-28 DIAGNOSIS — E113299 Type 2 diabetes mellitus with mild nonproliferative diabetic retinopathy without macular edema, unspecified eye: Secondary | ICD-10-CM | POA: Insufficient documentation

## 2015-04-06 ENCOUNTER — Other Ambulatory Visit: Payer: Self-pay

## 2015-04-06 DIAGNOSIS — Z1231 Encounter for screening mammogram for malignant neoplasm of breast: Secondary | ICD-10-CM

## 2015-05-09 ENCOUNTER — Ambulatory Visit
Admission: RE | Admit: 2015-05-09 | Discharge: 2015-05-09 | Disposition: A | Discharge status: Home / Self Care | Payer: Medicare Other | Source: Ambulatory Visit

## 2015-05-09 DIAGNOSIS — Z1231 Encounter for screening mammogram for malignant neoplasm of breast: Secondary | ICD-10-CM

## 2015-07-07 DIAGNOSIS — M25775 Osteophyte, left foot: Secondary | ICD-10-CM | POA: Diagnosis not present

## 2015-07-07 DIAGNOSIS — M71572 Other bursitis, not elsewhere classified, left ankle and foot: Secondary | ICD-10-CM | POA: Diagnosis not present

## 2015-07-10 DIAGNOSIS — N3020 Other chronic cystitis without hematuria: Secondary | ICD-10-CM | POA: Diagnosis not present

## 2015-07-20 DIAGNOSIS — E2839 Other primary ovarian failure: Secondary | ICD-10-CM | POA: Diagnosis not present

## 2015-07-21 DIAGNOSIS — M25775 Osteophyte, left foot: Secondary | ICD-10-CM | POA: Diagnosis not present

## 2015-09-25 DIAGNOSIS — E02 Subclinical iodine-deficiency hypothyroidism: Secondary | ICD-10-CM | POA: Diagnosis not present

## 2015-09-25 DIAGNOSIS — E1165 Type 2 diabetes mellitus with hyperglycemia: Secondary | ICD-10-CM | POA: Diagnosis not present

## 2015-09-25 DIAGNOSIS — E7800 Pure hypercholesterolemia, unspecified: Secondary | ICD-10-CM | POA: Diagnosis not present

## 2015-09-25 DIAGNOSIS — I10 Essential (primary) hypertension: Secondary | ICD-10-CM | POA: Diagnosis not present

## 2015-10-03 ENCOUNTER — Other Ambulatory Visit: Payer: Self-pay | Admitting: Physician Assistant

## 2015-10-03 DIAGNOSIS — C44629 Squamous cell carcinoma of skin of left upper limb, including shoulder: Secondary | ICD-10-CM | POA: Diagnosis not present

## 2015-10-10 DIAGNOSIS — E119 Type 2 diabetes mellitus without complications: Secondary | ICD-10-CM | POA: Diagnosis not present

## 2015-10-10 DIAGNOSIS — E785 Hyperlipidemia, unspecified: Secondary | ICD-10-CM | POA: Diagnosis not present

## 2015-10-10 DIAGNOSIS — E113293 Type 2 diabetes mellitus with mild nonproliferative diabetic retinopathy without macular edema, bilateral: Secondary | ICD-10-CM | POA: Diagnosis not present

## 2015-10-10 DIAGNOSIS — H1851 Endothelial corneal dystrophy: Secondary | ICD-10-CM | POA: Diagnosis not present

## 2015-10-10 DIAGNOSIS — H2511 Age-related nuclear cataract, right eye: Secondary | ICD-10-CM | POA: Diagnosis not present

## 2015-10-10 DIAGNOSIS — Z9842 Cataract extraction status, left eye: Secondary | ICD-10-CM | POA: Diagnosis not present

## 2015-10-10 DIAGNOSIS — Z79899 Other long term (current) drug therapy: Secondary | ICD-10-CM | POA: Diagnosis not present

## 2015-10-10 DIAGNOSIS — E1136 Type 2 diabetes mellitus with diabetic cataract: Secondary | ICD-10-CM | POA: Diagnosis not present

## 2015-10-10 DIAGNOSIS — Z7982 Long term (current) use of aspirin: Secondary | ICD-10-CM | POA: Diagnosis not present

## 2015-10-10 DIAGNOSIS — Z961 Presence of intraocular lens: Secondary | ICD-10-CM | POA: Diagnosis not present

## 2015-10-10 DIAGNOSIS — I10 Essential (primary) hypertension: Secondary | ICD-10-CM | POA: Diagnosis not present

## 2015-10-10 DIAGNOSIS — Z794 Long term (current) use of insulin: Secondary | ICD-10-CM | POA: Diagnosis not present

## 2015-10-10 DIAGNOSIS — H35372 Puckering of macula, left eye: Secondary | ICD-10-CM | POA: Diagnosis not present

## 2015-10-16 DIAGNOSIS — Z01419 Encounter for gynecological examination (general) (routine) without abnormal findings: Secondary | ICD-10-CM | POA: Diagnosis not present

## 2015-10-16 DIAGNOSIS — Z6830 Body mass index (BMI) 30.0-30.9, adult: Secondary | ICD-10-CM | POA: Diagnosis not present

## 2016-02-20 DIAGNOSIS — D239 Other benign neoplasm of skin, unspecified: Secondary | ICD-10-CM | POA: Diagnosis not present

## 2016-02-20 DIAGNOSIS — L821 Other seborrheic keratosis: Secondary | ICD-10-CM | POA: Diagnosis not present

## 2016-03-06 DIAGNOSIS — Z789 Other specified health status: Secondary | ICD-10-CM | POA: Diagnosis not present

## 2016-03-06 DIAGNOSIS — Z299 Encounter for prophylactic measures, unspecified: Secondary | ICD-10-CM | POA: Diagnosis not present

## 2016-03-06 DIAGNOSIS — N390 Urinary tract infection, site not specified: Secondary | ICD-10-CM | POA: Diagnosis not present

## 2016-03-06 DIAGNOSIS — Z6832 Body mass index (BMI) 32.0-32.9, adult: Secondary | ICD-10-CM | POA: Diagnosis not present

## 2016-03-06 DIAGNOSIS — R350 Frequency of micturition: Secondary | ICD-10-CM | POA: Diagnosis not present

## 2016-03-25 DIAGNOSIS — R300 Dysuria: Secondary | ICD-10-CM | POA: Diagnosis not present

## 2016-03-25 DIAGNOSIS — E7800 Pure hypercholesterolemia, unspecified: Secondary | ICD-10-CM | POA: Diagnosis not present

## 2016-03-25 DIAGNOSIS — I10 Essential (primary) hypertension: Secondary | ICD-10-CM | POA: Diagnosis not present

## 2016-03-25 DIAGNOSIS — E1165 Type 2 diabetes mellitus with hyperglycemia: Secondary | ICD-10-CM | POA: Diagnosis not present

## 2016-03-25 DIAGNOSIS — E02 Subclinical iodine-deficiency hypothyroidism: Secondary | ICD-10-CM | POA: Diagnosis not present

## 2016-03-25 DIAGNOSIS — Z23 Encounter for immunization: Secondary | ICD-10-CM | POA: Diagnosis not present

## 2016-04-02 ENCOUNTER — Other Ambulatory Visit: Payer: Self-pay | Admitting: Unknown Physician Specialty

## 2016-04-02 DIAGNOSIS — Z96653 Presence of artificial knee joint, bilateral: Secondary | ICD-10-CM | POA: Diagnosis not present

## 2016-04-02 DIAGNOSIS — Z471 Aftercare following joint replacement surgery: Secondary | ICD-10-CM | POA: Diagnosis not present

## 2016-04-02 DIAGNOSIS — Z1231 Encounter for screening mammogram for malignant neoplasm of breast: Secondary | ICD-10-CM

## 2016-04-02 DIAGNOSIS — Z96652 Presence of left artificial knee joint: Secondary | ICD-10-CM | POA: Diagnosis not present

## 2016-04-30 DIAGNOSIS — Z881 Allergy status to other antibiotic agents status: Secondary | ICD-10-CM | POA: Diagnosis not present

## 2016-04-30 DIAGNOSIS — Z961 Presence of intraocular lens: Secondary | ICD-10-CM | POA: Diagnosis not present

## 2016-04-30 DIAGNOSIS — H26492 Other secondary cataract, left eye: Secondary | ICD-10-CM | POA: Diagnosis not present

## 2016-04-30 DIAGNOSIS — Z794 Long term (current) use of insulin: Secondary | ICD-10-CM | POA: Diagnosis not present

## 2016-04-30 DIAGNOSIS — Z7982 Long term (current) use of aspirin: Secondary | ICD-10-CM | POA: Diagnosis not present

## 2016-04-30 DIAGNOSIS — H1851 Endothelial corneal dystrophy: Secondary | ICD-10-CM | POA: Diagnosis not present

## 2016-04-30 DIAGNOSIS — H02839 Dermatochalasis of unspecified eye, unspecified eyelid: Secondary | ICD-10-CM | POA: Diagnosis not present

## 2016-04-30 DIAGNOSIS — H35371 Puckering of macula, right eye: Secondary | ICD-10-CM | POA: Diagnosis not present

## 2016-04-30 DIAGNOSIS — H40031 Anatomical narrow angle, right eye: Secondary | ICD-10-CM | POA: Diagnosis not present

## 2016-04-30 DIAGNOSIS — E785 Hyperlipidemia, unspecified: Secondary | ICD-10-CM | POA: Diagnosis not present

## 2016-04-30 DIAGNOSIS — Z79899 Other long term (current) drug therapy: Secondary | ICD-10-CM | POA: Diagnosis not present

## 2016-04-30 DIAGNOSIS — H2511 Age-related nuclear cataract, right eye: Secondary | ICD-10-CM | POA: Diagnosis not present

## 2016-04-30 DIAGNOSIS — E119 Type 2 diabetes mellitus without complications: Secondary | ICD-10-CM | POA: Diagnosis not present

## 2016-04-30 DIAGNOSIS — Z9842 Cataract extraction status, left eye: Secondary | ICD-10-CM | POA: Diagnosis not present

## 2016-05-09 DIAGNOSIS — Z299 Encounter for prophylactic measures, unspecified: Secondary | ICD-10-CM | POA: Diagnosis not present

## 2016-05-09 DIAGNOSIS — Z1389 Encounter for screening for other disorder: Secondary | ICD-10-CM | POA: Diagnosis not present

## 2016-05-09 DIAGNOSIS — Z7189 Other specified counseling: Secondary | ICD-10-CM | POA: Diagnosis not present

## 2016-05-09 DIAGNOSIS — F329 Major depressive disorder, single episode, unspecified: Secondary | ICD-10-CM | POA: Diagnosis not present

## 2016-05-09 DIAGNOSIS — Z6832 Body mass index (BMI) 32.0-32.9, adult: Secondary | ICD-10-CM | POA: Diagnosis not present

## 2016-05-09 DIAGNOSIS — Z1211 Encounter for screening for malignant neoplasm of colon: Secondary | ICD-10-CM | POA: Diagnosis not present

## 2016-05-09 DIAGNOSIS — E1142 Type 2 diabetes mellitus with diabetic polyneuropathy: Secondary | ICD-10-CM | POA: Diagnosis not present

## 2016-05-09 DIAGNOSIS — Z0000 Encounter for general adult medical examination without abnormal findings: Secondary | ICD-10-CM | POA: Diagnosis not present

## 2016-05-10 ENCOUNTER — Ambulatory Visit
Admission: RE | Admit: 2016-05-10 | Discharge: 2016-05-10 | Disposition: A | Discharge status: Home / Self Care | Payer: Medicare Other | Source: Ambulatory Visit | Attending: Unknown Physician Specialty | Admitting: Unknown Physician Specialty

## 2016-05-10 DIAGNOSIS — E7800 Pure hypercholesterolemia, unspecified: Secondary | ICD-10-CM | POA: Diagnosis not present

## 2016-05-10 DIAGNOSIS — Z79899 Other long term (current) drug therapy: Secondary | ICD-10-CM | POA: Diagnosis not present

## 2016-05-10 DIAGNOSIS — R5383 Other fatigue: Secondary | ICD-10-CM | POA: Diagnosis not present

## 2016-05-10 DIAGNOSIS — E559 Vitamin D deficiency, unspecified: Secondary | ICD-10-CM | POA: Diagnosis not present

## 2016-05-10 DIAGNOSIS — Z1231 Encounter for screening mammogram for malignant neoplasm of breast: Secondary | ICD-10-CM | POA: Diagnosis not present

## 2016-06-24 DIAGNOSIS — J358 Other chronic diseases of tonsils and adenoids: Secondary | ICD-10-CM | POA: Diagnosis not present

## 2016-06-27 DIAGNOSIS — J040 Acute laryngitis: Secondary | ICD-10-CM | POA: Diagnosis not present

## 2016-06-27 DIAGNOSIS — H6121 Impacted cerumen, right ear: Secondary | ICD-10-CM | POA: Diagnosis not present

## 2016-07-09 ENCOUNTER — Encounter: Payer: Self-pay | Admitting: Internal Medicine

## 2016-07-12 DIAGNOSIS — J039 Acute tonsillitis, unspecified: Secondary | ICD-10-CM | POA: Diagnosis not present

## 2016-07-12 DIAGNOSIS — N3020 Other chronic cystitis without hematuria: Secondary | ICD-10-CM | POA: Diagnosis not present

## 2016-10-01 DIAGNOSIS — E02 Subclinical iodine-deficiency hypothyroidism: Secondary | ICD-10-CM | POA: Diagnosis not present

## 2016-10-01 DIAGNOSIS — E7800 Pure hypercholesterolemia, unspecified: Secondary | ICD-10-CM | POA: Diagnosis not present

## 2016-10-01 DIAGNOSIS — N390 Urinary tract infection, site not specified: Secondary | ICD-10-CM | POA: Diagnosis not present

## 2016-10-01 DIAGNOSIS — R300 Dysuria: Secondary | ICD-10-CM | POA: Diagnosis not present

## 2016-10-01 DIAGNOSIS — I10 Essential (primary) hypertension: Secondary | ICD-10-CM | POA: Diagnosis not present

## 2016-10-01 DIAGNOSIS — E1165 Type 2 diabetes mellitus with hyperglycemia: Secondary | ICD-10-CM | POA: Diagnosis not present

## 2016-12-06 DIAGNOSIS — M5412 Radiculopathy, cervical region: Secondary | ICD-10-CM | POA: Diagnosis not present

## 2016-12-06 DIAGNOSIS — Z794 Long term (current) use of insulin: Secondary | ICD-10-CM | POA: Diagnosis not present

## 2016-12-06 DIAGNOSIS — M50322 Other cervical disc degeneration at C5-C6 level: Secondary | ICD-10-CM | POA: Diagnosis not present

## 2016-12-06 DIAGNOSIS — E119 Type 2 diabetes mellitus without complications: Secondary | ICD-10-CM | POA: Diagnosis not present

## 2016-12-06 DIAGNOSIS — M542 Cervicalgia: Secondary | ICD-10-CM | POA: Diagnosis not present

## 2016-12-06 DIAGNOSIS — M62838 Other muscle spasm: Secondary | ICD-10-CM | POA: Diagnosis not present

## 2016-12-06 DIAGNOSIS — M50321 Other cervical disc degeneration at C4-C5 level: Secondary | ICD-10-CM | POA: Diagnosis not present

## 2016-12-06 DIAGNOSIS — M5030 Other cervical disc degeneration, unspecified cervical region: Secondary | ICD-10-CM | POA: Diagnosis not present

## 2017-02-04 DIAGNOSIS — E7800 Pure hypercholesterolemia, unspecified: Secondary | ICD-10-CM | POA: Diagnosis not present

## 2017-02-04 DIAGNOSIS — E1165 Type 2 diabetes mellitus with hyperglycemia: Secondary | ICD-10-CM | POA: Diagnosis not present

## 2017-02-04 DIAGNOSIS — E02 Subclinical iodine-deficiency hypothyroidism: Secondary | ICD-10-CM | POA: Diagnosis not present

## 2017-02-04 DIAGNOSIS — I10 Essential (primary) hypertension: Secondary | ICD-10-CM | POA: Diagnosis not present

## 2017-03-31 ENCOUNTER — Other Ambulatory Visit: Payer: Self-pay | Admitting: Unknown Physician Specialty

## 2017-03-31 DIAGNOSIS — Z1231 Encounter for screening mammogram for malignant neoplasm of breast: Secondary | ICD-10-CM

## 2017-04-07 DIAGNOSIS — E1142 Type 2 diabetes mellitus with diabetic polyneuropathy: Secondary | ICD-10-CM | POA: Diagnosis not present

## 2017-04-07 DIAGNOSIS — Z299 Encounter for prophylactic measures, unspecified: Secondary | ICD-10-CM | POA: Diagnosis not present

## 2017-04-07 DIAGNOSIS — J069 Acute upper respiratory infection, unspecified: Secondary | ICD-10-CM | POA: Diagnosis not present

## 2017-04-07 DIAGNOSIS — Z789 Other specified health status: Secondary | ICD-10-CM | POA: Diagnosis not present

## 2017-04-07 DIAGNOSIS — E11319 Type 2 diabetes mellitus with unspecified diabetic retinopathy without macular edema: Secondary | ICD-10-CM | POA: Diagnosis not present

## 2017-04-07 DIAGNOSIS — I739 Peripheral vascular disease, unspecified: Secondary | ICD-10-CM | POA: Diagnosis not present

## 2017-04-07 DIAGNOSIS — Z6832 Body mass index (BMI) 32.0-32.9, adult: Secondary | ICD-10-CM | POA: Diagnosis not present

## 2017-04-15 DIAGNOSIS — H2511 Age-related nuclear cataract, right eye: Secondary | ICD-10-CM | POA: Diagnosis not present

## 2017-04-15 DIAGNOSIS — H35362 Drusen (degenerative) of macula, left eye: Secondary | ICD-10-CM | POA: Diagnosis not present

## 2017-04-15 DIAGNOSIS — Z961 Presence of intraocular lens: Secondary | ICD-10-CM | POA: Diagnosis not present

## 2017-04-15 DIAGNOSIS — Z9842 Cataract extraction status, left eye: Secondary | ICD-10-CM | POA: Diagnosis not present

## 2017-04-15 DIAGNOSIS — Z794 Long term (current) use of insulin: Secondary | ICD-10-CM | POA: Diagnosis not present

## 2017-04-15 DIAGNOSIS — E119 Type 2 diabetes mellitus without complications: Secondary | ICD-10-CM | POA: Diagnosis not present

## 2017-04-15 DIAGNOSIS — H1851 Endothelial corneal dystrophy: Secondary | ICD-10-CM | POA: Diagnosis not present

## 2017-04-29 DIAGNOSIS — Z23 Encounter for immunization: Secondary | ICD-10-CM | POA: Diagnosis not present

## 2017-05-13 DIAGNOSIS — Z79899 Other long term (current) drug therapy: Secondary | ICD-10-CM | POA: Diagnosis not present

## 2017-05-13 DIAGNOSIS — E1142 Type 2 diabetes mellitus with diabetic polyneuropathy: Secondary | ICD-10-CM | POA: Diagnosis not present

## 2017-05-13 DIAGNOSIS — E7800 Pure hypercholesterolemia, unspecified: Secondary | ICD-10-CM | POA: Diagnosis not present

## 2017-05-13 DIAGNOSIS — Z0000 Encounter for general adult medical examination without abnormal findings: Secondary | ICD-10-CM | POA: Diagnosis not present

## 2017-05-13 DIAGNOSIS — Z6831 Body mass index (BMI) 31.0-31.9, adult: Secondary | ICD-10-CM | POA: Diagnosis not present

## 2017-05-13 DIAGNOSIS — Z1339 Encounter for screening examination for other mental health and behavioral disorders: Secondary | ICD-10-CM | POA: Diagnosis not present

## 2017-05-13 DIAGNOSIS — Z299 Encounter for prophylactic measures, unspecified: Secondary | ICD-10-CM | POA: Diagnosis not present

## 2017-05-13 DIAGNOSIS — Z789 Other specified health status: Secondary | ICD-10-CM | POA: Diagnosis not present

## 2017-05-13 DIAGNOSIS — R5383 Other fatigue: Secondary | ICD-10-CM | POA: Diagnosis not present

## 2017-05-13 DIAGNOSIS — Z1211 Encounter for screening for malignant neoplasm of colon: Secondary | ICD-10-CM | POA: Diagnosis not present

## 2017-05-13 DIAGNOSIS — Z7189 Other specified counseling: Secondary | ICD-10-CM | POA: Diagnosis not present

## 2017-05-13 DIAGNOSIS — Z1331 Encounter for screening for depression: Secondary | ICD-10-CM | POA: Diagnosis not present

## 2017-05-14 ENCOUNTER — Ambulatory Visit: Payer: Medicare Other

## 2017-05-14 DIAGNOSIS — Z79899 Other long term (current) drug therapy: Secondary | ICD-10-CM | POA: Diagnosis not present

## 2017-05-14 DIAGNOSIS — E7800 Pure hypercholesterolemia, unspecified: Secondary | ICD-10-CM | POA: Diagnosis not present

## 2017-05-14 DIAGNOSIS — E559 Vitamin D deficiency, unspecified: Secondary | ICD-10-CM | POA: Diagnosis not present

## 2017-05-14 DIAGNOSIS — R5383 Other fatigue: Secondary | ICD-10-CM | POA: Diagnosis not present

## 2017-05-15 ENCOUNTER — Ambulatory Visit
Admission: RE | Admit: 2017-05-15 | Discharge: 2017-05-15 | Disposition: A | Discharge status: Home / Self Care | Payer: Medicare Other | Source: Ambulatory Visit | Attending: Unknown Physician Specialty | Admitting: Unknown Physician Specialty

## 2017-05-15 DIAGNOSIS — Z1231 Encounter for screening mammogram for malignant neoplasm of breast: Secondary | ICD-10-CM

## 2017-06-17 DIAGNOSIS — N390 Urinary tract infection, site not specified: Secondary | ICD-10-CM | POA: Diagnosis not present

## 2017-06-17 DIAGNOSIS — R300 Dysuria: Secondary | ICD-10-CM | POA: Diagnosis not present

## 2017-06-17 DIAGNOSIS — E1140 Type 2 diabetes mellitus with diabetic neuropathy, unspecified: Secondary | ICD-10-CM | POA: Diagnosis not present

## 2017-06-17 DIAGNOSIS — R109 Unspecified abdominal pain: Secondary | ICD-10-CM | POA: Diagnosis not present

## 2017-06-17 DIAGNOSIS — E7800 Pure hypercholesterolemia, unspecified: Secondary | ICD-10-CM | POA: Diagnosis not present

## 2017-06-17 DIAGNOSIS — E1165 Type 2 diabetes mellitus with hyperglycemia: Secondary | ICD-10-CM | POA: Diagnosis not present

## 2017-06-17 DIAGNOSIS — E02 Subclinical iodine-deficiency hypothyroidism: Secondary | ICD-10-CM | POA: Diagnosis not present

## 2017-06-17 DIAGNOSIS — I10 Essential (primary) hypertension: Secondary | ICD-10-CM | POA: Diagnosis not present

## 2017-06-21 ENCOUNTER — Other Ambulatory Visit: Payer: Self-pay | Admitting: Endocrinology

## 2017-06-21 DIAGNOSIS — R1084 Generalized abdominal pain: Secondary | ICD-10-CM

## 2017-06-27 ENCOUNTER — Ambulatory Visit
Admission: RE | Admit: 2017-06-27 | Discharge: 2017-06-27 | Disposition: A | Discharge status: Home / Self Care | Payer: Medicare Other | Source: Ambulatory Visit | Attending: Endocrinology | Admitting: Endocrinology

## 2017-06-27 DIAGNOSIS — K7689 Other specified diseases of liver: Secondary | ICD-10-CM | POA: Diagnosis not present

## 2017-06-27 DIAGNOSIS — R1084 Generalized abdominal pain: Secondary | ICD-10-CM

## 2017-06-27 MED ORDER — IOPAMIDOL (ISOVUE-300) INJECTION 61%
100.0000 mL | Freq: Once | INTRAVENOUS | Status: AC | PRN
  Administered 2017-06-27: 100 mL via INTRAVENOUS

## 2017-07-12 ENCOUNTER — Other Ambulatory Visit: Payer: Self-pay | Admitting: Endocrinology

## 2017-07-12 DIAGNOSIS — R160 Hepatomegaly, not elsewhere classified: Secondary | ICD-10-CM

## 2017-07-14 DIAGNOSIS — N3020 Other chronic cystitis without hematuria: Secondary | ICD-10-CM | POA: Diagnosis not present

## 2017-07-14 DIAGNOSIS — E113293 Type 2 diabetes mellitus with mild nonproliferative diabetic retinopathy without macular edema, bilateral: Secondary | ICD-10-CM | POA: Diagnosis not present

## 2017-07-24 ENCOUNTER — Ambulatory Visit
Admission: RE | Admit: 2017-07-24 | Discharge: 2017-07-24 | Disposition: A | Discharge status: Home / Self Care | Payer: Medicare Other | Source: Ambulatory Visit | Attending: Endocrinology | Admitting: Endocrinology

## 2017-07-24 DIAGNOSIS — R160 Hepatomegaly, not elsewhere classified: Secondary | ICD-10-CM

## 2017-07-24 DIAGNOSIS — D1809 Hemangioma of other sites: Secondary | ICD-10-CM | POA: Diagnosis not present

## 2017-07-24 MED ORDER — GADOBENATE DIMEGLUMINE 529 MG/ML IV SOLN: 15.0000 mL | Freq: Once | INTRAVENOUS | Status: DC | PRN

## 2017-07-24 MED ORDER — GADOBENATE DIMEGLUMINE 529 MG/ML IV SOLN
15.0000 mL | Freq: Once | INTRAVENOUS | Status: AC | PRN
  Administered 2017-07-24: 15 mL via INTRAVENOUS

## 2017-08-28 DIAGNOSIS — E7800 Pure hypercholesterolemia, unspecified: Secondary | ICD-10-CM | POA: Diagnosis not present

## 2017-08-28 DIAGNOSIS — E02 Subclinical iodine-deficiency hypothyroidism: Secondary | ICD-10-CM | POA: Diagnosis not present

## 2017-08-28 DIAGNOSIS — I10 Essential (primary) hypertension: Secondary | ICD-10-CM | POA: Diagnosis not present

## 2017-08-28 DIAGNOSIS — E1140 Type 2 diabetes mellitus with diabetic neuropathy, unspecified: Secondary | ICD-10-CM | POA: Diagnosis not present

## 2017-08-28 DIAGNOSIS — E1165 Type 2 diabetes mellitus with hyperglycemia: Secondary | ICD-10-CM | POA: Diagnosis not present

## 2017-09-11 DIAGNOSIS — Z789 Other specified health status: Secondary | ICD-10-CM | POA: Diagnosis not present

## 2017-09-11 DIAGNOSIS — E7800 Pure hypercholesterolemia, unspecified: Secondary | ICD-10-CM | POA: Diagnosis not present

## 2017-09-11 DIAGNOSIS — E1165 Type 2 diabetes mellitus with hyperglycemia: Secondary | ICD-10-CM | POA: Diagnosis not present

## 2017-09-11 DIAGNOSIS — E11319 Type 2 diabetes mellitus with unspecified diabetic retinopathy without macular edema: Secondary | ICD-10-CM | POA: Diagnosis not present

## 2017-09-11 DIAGNOSIS — E1142 Type 2 diabetes mellitus with diabetic polyneuropathy: Secondary | ICD-10-CM | POA: Diagnosis not present

## 2017-09-11 DIAGNOSIS — Z6831 Body mass index (BMI) 31.0-31.9, adult: Secondary | ICD-10-CM | POA: Diagnosis not present

## 2017-09-11 DIAGNOSIS — Z299 Encounter for prophylactic measures, unspecified: Secondary | ICD-10-CM | POA: Diagnosis not present

## 2017-09-18 DIAGNOSIS — M79604 Pain in right leg: Secondary | ICD-10-CM | POA: Diagnosis not present

## 2017-09-18 DIAGNOSIS — M5416 Radiculopathy, lumbar region: Secondary | ICD-10-CM | POA: Insufficient documentation

## 2017-09-18 DIAGNOSIS — M6289 Other specified disorders of muscle: Secondary | ICD-10-CM | POA: Diagnosis not present

## 2017-09-18 DIAGNOSIS — Z96653 Presence of artificial knee joint, bilateral: Secondary | ICD-10-CM | POA: Diagnosis not present

## 2017-09-18 DIAGNOSIS — M5136 Other intervertebral disc degeneration, lumbar region: Secondary | ICD-10-CM | POA: Diagnosis not present

## 2017-09-18 DIAGNOSIS — R202 Paresthesia of skin: Secondary | ICD-10-CM | POA: Diagnosis not present

## 2017-10-14 DIAGNOSIS — H1851 Endothelial corneal dystrophy: Secondary | ICD-10-CM | POA: Diagnosis not present

## 2017-10-14 DIAGNOSIS — H2511 Age-related nuclear cataract, right eye: Secondary | ICD-10-CM | POA: Diagnosis not present

## 2017-10-14 DIAGNOSIS — H18519 Endothelial corneal dystrophy, unspecified eye: Secondary | ICD-10-CM | POA: Insufficient documentation

## 2017-10-14 DIAGNOSIS — Z794 Long term (current) use of insulin: Secondary | ICD-10-CM | POA: Diagnosis not present

## 2017-10-14 DIAGNOSIS — H35372 Puckering of macula, left eye: Secondary | ICD-10-CM | POA: Diagnosis not present

## 2017-10-14 DIAGNOSIS — E119 Type 2 diabetes mellitus without complications: Secondary | ICD-10-CM | POA: Diagnosis not present

## 2017-10-14 DIAGNOSIS — Z961 Presence of intraocular lens: Secondary | ICD-10-CM | POA: Diagnosis not present

## 2017-10-21 DIAGNOSIS — M47816 Spondylosis without myelopathy or radiculopathy, lumbar region: Secondary | ICD-10-CM | POA: Diagnosis not present

## 2017-10-21 DIAGNOSIS — M4155 Other secondary scoliosis, thoracolumbar region: Secondary | ICD-10-CM | POA: Diagnosis not present

## 2017-10-21 DIAGNOSIS — M545 Low back pain: Secondary | ICD-10-CM | POA: Diagnosis not present

## 2017-10-21 DIAGNOSIS — M546 Pain in thoracic spine: Secondary | ICD-10-CM | POA: Diagnosis not present

## 2017-10-21 DIAGNOSIS — M5136 Other intervertebral disc degeneration, lumbar region: Secondary | ICD-10-CM | POA: Diagnosis not present

## 2017-10-21 DIAGNOSIS — M5440 Lumbago with sciatica, unspecified side: Secondary | ICD-10-CM | POA: Diagnosis not present

## 2017-10-21 DIAGNOSIS — M5416 Radiculopathy, lumbar region: Secondary | ICD-10-CM | POA: Diagnosis not present

## 2017-10-22 DIAGNOSIS — H1851 Endothelial corneal dystrophy: Secondary | ICD-10-CM | POA: Diagnosis not present

## 2017-10-22 DIAGNOSIS — Z961 Presence of intraocular lens: Secondary | ICD-10-CM | POA: Diagnosis not present

## 2017-10-22 DIAGNOSIS — E118 Type 2 diabetes mellitus with unspecified complications: Secondary | ICD-10-CM | POA: Diagnosis not present

## 2017-10-22 DIAGNOSIS — H2511 Age-related nuclear cataract, right eye: Secondary | ICD-10-CM | POA: Diagnosis not present

## 2017-10-22 DIAGNOSIS — Z794 Long term (current) use of insulin: Secondary | ICD-10-CM | POA: Diagnosis not present

## 2017-10-22 DIAGNOSIS — Z9842 Cataract extraction status, left eye: Secondary | ICD-10-CM | POA: Diagnosis not present

## 2017-10-22 DIAGNOSIS — I10 Essential (primary) hypertension: Secondary | ICD-10-CM | POA: Diagnosis not present

## 2017-10-23 DIAGNOSIS — Z794 Long term (current) use of insulin: Secondary | ICD-10-CM | POA: Diagnosis not present

## 2017-10-23 DIAGNOSIS — Z9889 Other specified postprocedural states: Secondary | ICD-10-CM | POA: Diagnosis not present

## 2017-10-23 DIAGNOSIS — Z8669 Personal history of other diseases of the nervous system and sense organs: Secondary | ICD-10-CM | POA: Diagnosis not present

## 2017-10-23 DIAGNOSIS — Z9841 Cataract extraction status, right eye: Secondary | ICD-10-CM | POA: Diagnosis not present

## 2017-10-23 DIAGNOSIS — H35372 Puckering of macula, left eye: Secondary | ICD-10-CM | POA: Diagnosis not present

## 2017-10-23 DIAGNOSIS — Z48810 Encounter for surgical aftercare following surgery on the sense organs: Secondary | ICD-10-CM | POA: Diagnosis not present

## 2017-10-23 DIAGNOSIS — Z961 Presence of intraocular lens: Secondary | ICD-10-CM | POA: Diagnosis not present

## 2017-10-23 DIAGNOSIS — E119 Type 2 diabetes mellitus without complications: Secondary | ICD-10-CM | POA: Diagnosis not present

## 2017-10-29 DIAGNOSIS — M48061 Spinal stenosis, lumbar region without neurogenic claudication: Secondary | ICD-10-CM | POA: Diagnosis not present

## 2017-10-29 DIAGNOSIS — M5440 Lumbago with sciatica, unspecified side: Secondary | ICD-10-CM | POA: Diagnosis not present

## 2017-10-29 DIAGNOSIS — M5126 Other intervertebral disc displacement, lumbar region: Secondary | ICD-10-CM | POA: Diagnosis not present

## 2017-11-03 DIAGNOSIS — M5136 Other intervertebral disc degeneration, lumbar region: Secondary | ICD-10-CM | POA: Diagnosis not present

## 2017-11-03 DIAGNOSIS — M4155 Other secondary scoliosis, thoracolumbar region: Secondary | ICD-10-CM | POA: Diagnosis not present

## 2017-11-03 DIAGNOSIS — M5440 Lumbago with sciatica, unspecified side: Secondary | ICD-10-CM | POA: Diagnosis not present

## 2017-11-03 DIAGNOSIS — M5416 Radiculopathy, lumbar region: Secondary | ICD-10-CM | POA: Diagnosis not present

## 2017-11-03 DIAGNOSIS — M47816 Spondylosis without myelopathy or radiculopathy, lumbar region: Secondary | ICD-10-CM | POA: Diagnosis not present

## 2017-11-04 DIAGNOSIS — Z961 Presence of intraocular lens: Secondary | ICD-10-CM | POA: Diagnosis not present

## 2017-11-04 DIAGNOSIS — H0288B Meibomian gland dysfunction left eye, upper and lower eyelids: Secondary | ICD-10-CM | POA: Diagnosis not present

## 2017-11-04 DIAGNOSIS — H35351 Cystoid macular degeneration, right eye: Secondary | ICD-10-CM | POA: Diagnosis not present

## 2017-11-04 DIAGNOSIS — Z9841 Cataract extraction status, right eye: Secondary | ICD-10-CM | POA: Diagnosis not present

## 2017-11-04 DIAGNOSIS — Z48810 Encounter for surgical aftercare following surgery on the sense organs: Secondary | ICD-10-CM | POA: Diagnosis not present

## 2017-11-04 DIAGNOSIS — H0288A Meibomian gland dysfunction right eye, upper and lower eyelids: Secondary | ICD-10-CM | POA: Diagnosis not present

## 2017-11-20 DIAGNOSIS — Z48810 Encounter for surgical aftercare following surgery on the sense organs: Secondary | ICD-10-CM | POA: Diagnosis not present

## 2017-11-20 DIAGNOSIS — H35372 Puckering of macula, left eye: Secondary | ICD-10-CM | POA: Diagnosis not present

## 2017-11-20 DIAGNOSIS — Z794 Long term (current) use of insulin: Secondary | ICD-10-CM | POA: Diagnosis not present

## 2017-11-20 DIAGNOSIS — H0288A Meibomian gland dysfunction right eye, upper and lower eyelids: Secondary | ICD-10-CM | POA: Diagnosis not present

## 2017-11-20 DIAGNOSIS — Z9841 Cataract extraction status, right eye: Secondary | ICD-10-CM | POA: Diagnosis not present

## 2017-11-20 DIAGNOSIS — H43811 Vitreous degeneration, right eye: Secondary | ICD-10-CM | POA: Diagnosis not present

## 2017-11-20 DIAGNOSIS — H0288B Meibomian gland dysfunction left eye, upper and lower eyelids: Secondary | ICD-10-CM | POA: Diagnosis not present

## 2017-11-20 DIAGNOSIS — H35351 Cystoid macular degeneration, right eye: Secondary | ICD-10-CM | POA: Diagnosis not present

## 2017-11-20 DIAGNOSIS — E119 Type 2 diabetes mellitus without complications: Secondary | ICD-10-CM | POA: Diagnosis not present

## 2017-11-20 DIAGNOSIS — Z961 Presence of intraocular lens: Secondary | ICD-10-CM | POA: Diagnosis not present

## 2017-12-11 DIAGNOSIS — I739 Peripheral vascular disease, unspecified: Secondary | ICD-10-CM | POA: Diagnosis not present

## 2017-12-11 DIAGNOSIS — Z299 Encounter for prophylactic measures, unspecified: Secondary | ICD-10-CM | POA: Diagnosis not present

## 2017-12-11 DIAGNOSIS — E1165 Type 2 diabetes mellitus with hyperglycemia: Secondary | ICD-10-CM | POA: Diagnosis not present

## 2017-12-11 DIAGNOSIS — E11319 Type 2 diabetes mellitus with unspecified diabetic retinopathy without macular edema: Secondary | ICD-10-CM | POA: Diagnosis not present

## 2017-12-11 DIAGNOSIS — Z6831 Body mass index (BMI) 31.0-31.9, adult: Secondary | ICD-10-CM | POA: Diagnosis not present

## 2017-12-11 DIAGNOSIS — R21 Rash and other nonspecific skin eruption: Secondary | ICD-10-CM | POA: Diagnosis not present

## 2017-12-25 DIAGNOSIS — H35351 Cystoid macular degeneration, right eye: Secondary | ICD-10-CM | POA: Diagnosis not present

## 2017-12-25 DIAGNOSIS — E119 Type 2 diabetes mellitus without complications: Secondary | ICD-10-CM | POA: Diagnosis not present

## 2017-12-25 DIAGNOSIS — H35372 Puckering of macula, left eye: Secondary | ICD-10-CM | POA: Diagnosis not present

## 2017-12-25 DIAGNOSIS — Z9841 Cataract extraction status, right eye: Secondary | ICD-10-CM | POA: Diagnosis not present

## 2017-12-25 DIAGNOSIS — Z794 Long term (current) use of insulin: Secondary | ICD-10-CM | POA: Diagnosis not present

## 2017-12-25 DIAGNOSIS — Z961 Presence of intraocular lens: Secondary | ICD-10-CM | POA: Diagnosis not present

## 2018-01-29 DIAGNOSIS — H35362 Drusen (degenerative) of macula, left eye: Secondary | ICD-10-CM | POA: Diagnosis not present

## 2018-01-29 DIAGNOSIS — H02401 Unspecified ptosis of right eyelid: Secondary | ICD-10-CM | POA: Diagnosis not present

## 2018-01-29 DIAGNOSIS — Z881 Allergy status to other antibiotic agents status: Secondary | ICD-10-CM | POA: Diagnosis not present

## 2018-01-29 DIAGNOSIS — Z48810 Encounter for surgical aftercare following surgery on the sense organs: Secondary | ICD-10-CM | POA: Diagnosis not present

## 2018-01-29 DIAGNOSIS — E119 Type 2 diabetes mellitus without complications: Secondary | ICD-10-CM | POA: Diagnosis not present

## 2018-01-29 DIAGNOSIS — Z961 Presence of intraocular lens: Secondary | ICD-10-CM | POA: Diagnosis not present

## 2018-01-29 DIAGNOSIS — H02836 Dermatochalasis of left eye, unspecified eyelid: Secondary | ICD-10-CM | POA: Diagnosis not present

## 2018-01-29 DIAGNOSIS — H02833 Dermatochalasis of right eye, unspecified eyelid: Secondary | ICD-10-CM | POA: Diagnosis not present

## 2018-01-29 DIAGNOSIS — H35351 Cystoid macular degeneration, right eye: Secondary | ICD-10-CM | POA: Diagnosis not present

## 2018-01-29 DIAGNOSIS — H18451 Nodular corneal degeneration, right eye: Secondary | ICD-10-CM | POA: Diagnosis not present

## 2018-03-03 DIAGNOSIS — E02 Subclinical iodine-deficiency hypothyroidism: Secondary | ICD-10-CM | POA: Diagnosis not present

## 2018-03-03 DIAGNOSIS — E1165 Type 2 diabetes mellitus with hyperglycemia: Secondary | ICD-10-CM | POA: Diagnosis not present

## 2018-03-03 DIAGNOSIS — I10 Essential (primary) hypertension: Secondary | ICD-10-CM | POA: Diagnosis not present

## 2018-03-03 DIAGNOSIS — E7800 Pure hypercholesterolemia, unspecified: Secondary | ICD-10-CM | POA: Diagnosis not present

## 2018-03-03 DIAGNOSIS — E1140 Type 2 diabetes mellitus with diabetic neuropathy, unspecified: Secondary | ICD-10-CM | POA: Diagnosis not present

## 2018-03-06 DIAGNOSIS — Z961 Presence of intraocular lens: Secondary | ICD-10-CM | POA: Diagnosis not present

## 2018-03-06 DIAGNOSIS — E119 Type 2 diabetes mellitus without complications: Secondary | ICD-10-CM | POA: Diagnosis not present

## 2018-03-06 DIAGNOSIS — Z9841 Cataract extraction status, right eye: Secondary | ICD-10-CM | POA: Diagnosis not present

## 2018-03-06 DIAGNOSIS — Z794 Long term (current) use of insulin: Secondary | ICD-10-CM | POA: Diagnosis not present

## 2018-03-06 DIAGNOSIS — H35351 Cystoid macular degeneration, right eye: Secondary | ICD-10-CM | POA: Diagnosis not present

## 2018-03-06 DIAGNOSIS — H35372 Puckering of macula, left eye: Secondary | ICD-10-CM | POA: Diagnosis not present

## 2018-03-06 DIAGNOSIS — Z79899 Other long term (current) drug therapy: Secondary | ICD-10-CM | POA: Diagnosis not present

## 2018-03-17 DIAGNOSIS — I739 Peripheral vascular disease, unspecified: Secondary | ICD-10-CM | POA: Diagnosis not present

## 2018-03-17 DIAGNOSIS — Z6831 Body mass index (BMI) 31.0-31.9, adult: Secondary | ICD-10-CM | POA: Diagnosis not present

## 2018-03-17 DIAGNOSIS — E1165 Type 2 diabetes mellitus with hyperglycemia: Secondary | ICD-10-CM | POA: Diagnosis not present

## 2018-03-17 DIAGNOSIS — E11319 Type 2 diabetes mellitus with unspecified diabetic retinopathy without macular edema: Secondary | ICD-10-CM | POA: Diagnosis not present

## 2018-03-17 DIAGNOSIS — Z299 Encounter for prophylactic measures, unspecified: Secondary | ICD-10-CM | POA: Diagnosis not present

## 2018-04-01 DIAGNOSIS — Z23 Encounter for immunization: Secondary | ICD-10-CM | POA: Diagnosis not present

## 2018-05-15 DIAGNOSIS — Z299 Encounter for prophylactic measures, unspecified: Secondary | ICD-10-CM | POA: Diagnosis not present

## 2018-05-15 DIAGNOSIS — Z1331 Encounter for screening for depression: Secondary | ICD-10-CM | POA: Diagnosis not present

## 2018-05-15 DIAGNOSIS — R5383 Other fatigue: Secondary | ICD-10-CM | POA: Diagnosis not present

## 2018-05-15 DIAGNOSIS — Z6831 Body mass index (BMI) 31.0-31.9, adult: Secondary | ICD-10-CM | POA: Diagnosis not present

## 2018-05-15 DIAGNOSIS — E559 Vitamin D deficiency, unspecified: Secondary | ICD-10-CM | POA: Diagnosis not present

## 2018-05-15 DIAGNOSIS — E7800 Pure hypercholesterolemia, unspecified: Secondary | ICD-10-CM | POA: Diagnosis not present

## 2018-05-15 DIAGNOSIS — Z1211 Encounter for screening for malignant neoplasm of colon: Secondary | ICD-10-CM | POA: Diagnosis not present

## 2018-05-15 DIAGNOSIS — Z1339 Encounter for screening examination for other mental health and behavioral disorders: Secondary | ICD-10-CM | POA: Diagnosis not present

## 2018-05-15 DIAGNOSIS — Z0000 Encounter for general adult medical examination without abnormal findings: Secondary | ICD-10-CM | POA: Diagnosis not present

## 2018-05-15 DIAGNOSIS — Z79899 Other long term (current) drug therapy: Secondary | ICD-10-CM | POA: Diagnosis not present

## 2018-05-15 DIAGNOSIS — Z7189 Other specified counseling: Secondary | ICD-10-CM | POA: Diagnosis not present

## 2018-06-26 DIAGNOSIS — E1121 Type 2 diabetes mellitus with diabetic nephropathy: Secondary | ICD-10-CM | POA: Diagnosis not present

## 2018-06-26 DIAGNOSIS — N3020 Other chronic cystitis without hematuria: Secondary | ICD-10-CM | POA: Diagnosis not present

## 2018-06-26 DIAGNOSIS — R399 Unspecified symptoms and signs involving the genitourinary system: Secondary | ICD-10-CM | POA: Diagnosis not present

## 2018-07-02 DIAGNOSIS — M5416 Radiculopathy, lumbar region: Secondary | ICD-10-CM | POA: Diagnosis not present

## 2018-07-02 DIAGNOSIS — M47816 Spondylosis without myelopathy or radiculopathy, lumbar region: Secondary | ICD-10-CM | POA: Diagnosis not present

## 2018-07-02 DIAGNOSIS — M4726 Other spondylosis with radiculopathy, lumbar region: Secondary | ICD-10-CM | POA: Diagnosis not present

## 2018-07-02 DIAGNOSIS — M5136 Other intervertebral disc degeneration, lumbar region: Secondary | ICD-10-CM | POA: Diagnosis not present

## 2018-07-09 DIAGNOSIS — E2839 Other primary ovarian failure: Secondary | ICD-10-CM | POA: Diagnosis not present

## 2018-07-30 DIAGNOSIS — Z8781 Personal history of (healed) traumatic fracture: Secondary | ICD-10-CM | POA: Insufficient documentation

## 2018-07-30 DIAGNOSIS — Z9889 Other specified postprocedural states: Secondary | ICD-10-CM | POA: Insufficient documentation

## 2018-08-01 ENCOUNTER — Ambulatory Visit (INDEPENDENT_AMBULATORY_CARE_PROVIDER_SITE_OTHER): Payer: Medicare Other | Admitting: Podiatry

## 2018-08-01 ENCOUNTER — Encounter: Payer: Self-pay | Admitting: Podiatry

## 2018-08-01 DIAGNOSIS — L989 Disorder of the skin and subcutaneous tissue, unspecified: Secondary | ICD-10-CM

## 2018-08-01 DIAGNOSIS — L600 Ingrowing nail: Secondary | ICD-10-CM | POA: Diagnosis not present

## 2018-08-01 MED ORDER — GENTAMICIN SULFATE 0.1 % EX CREA: 1.0000 "application " | TOPICAL_CREAM | Freq: Two times a day (BID) | CUTANEOUS | 0 refills | Status: DC

## 2018-08-01 NOTE — Patient Instructions (Addendum)
Apply corn and callus remover from pharmacy to the skin callus.  Has salicylic acid.  Soak Instructions    THE DAY AFTER THE PROCEDURE  Place 1/4 cup of epsom salts in a quart of warm tap water.  Submerge your foot or feet with outer bandage intact for the initial soak; this will allow the bandage to become moist and wet for easy lift off.  Once you remove your bandage, continue to soak in the solution for 20 minutes.  This soak should be done twice a day.  Next, remove your foot or feet from solution, blot dry the affected area and cover.  You may use a band aid large enough to cover the area or use gauze and tape.  Apply other medications to the area as directed by the doctor such as polysporin neosporin.  IF YOUR SKIN BECOMES IRRITATED WHILE USING THESE INSTRUCTIONS, IT IS OKAY TO SWITCH TO  WHITE VINEGAR AND WATER. Or you may use antibacterial soap and water to keep the toe clean  Monitor for any signs/symptoms of infection. Call the office immediately if any occur or go directly to the emergency room. Call with any questions/concerns.    [facility] Instructions-Post Nail Surgery  You have had your ingrown toenail and root treated with a chemical.  This chemical causes a burn that will drain and ooze like a blister.  This can drain for 6-8 weeks or longer.  It is important to keep this area clean, covered, and follow the soaking instructions dispensed at the time of your surgery.  This area will eventually dry and form a scab.  Once the scab forms you no longer need to soak or apply a dressing.  If at any time you experience an increase in pain, redness, swelling, or drainage, you should contact the office as soon as possible.

## 2018-08-03 ENCOUNTER — Telehealth: Payer: Self-pay | Admitting: Podiatry

## 2018-08-03 NOTE — Telephone Encounter (Signed)
Pt was seen on 08/01/18 for an ingrown toenail and also had a place removed from the bottom of her foot. Pts husband called asking for clarification on care for the area on the bottom of her foot. Please give them a call back

## 2018-08-03 NOTE — Telephone Encounter (Signed)
Pt's husband, Lissa Hoard states the toe area looks good, but the area under the foot that is white and the black spot is gone, how far out is he suppose to put the salicylic acid.

## 2018-08-04 NOTE — Telephone Encounter (Signed)
Pts husband called following up on message from yesterday regarding care for his wife's callus.

## 2018-08-04 NOTE — Telephone Encounter (Signed)
I called and spoke with patient's husband.  I instructed him per Dr. Amalia Hailey to only put a small dot of salicylic acid in middle of area on bottom of foot.  I educated him on s/s of infection and he verbalized understanding.

## 2018-08-10 NOTE — Progress Notes (Signed)
   Subjective: Patient presents today for evaluation of intermittent pain to the medial border of the left hallux that began a few years ago. Patient is concerned for possible ingrown nail. Wearing shoes increases the pain. She states her husband cut the nail out two weeks ago for treatment.  She also complains of a painful callus lesion on the sub-fifth MPJ of the left foot that has been present for the past few months. Walking increases the pain. She has been soaking the foot in Epsom salt for treatment. Patient presents today for further treatment and evaluation.  Past Medical History:  Diagnosis Date  . Arthritis   . Complication of anesthesia   . Diabetes mellitus   . DVT (deep venous thrombosis) ([place]) 2001   left  took Lovenox injections  . Edema   . Hyperlipidemia   . Neuropathy   . Peripheral vascular disease ([place])   . PONV (postoperative nausea and vomiting)   . Thrombophlebitis   . Varicose veins     Objective:  General: Well developed, nourished, in no acute distress, alert and oriented x3   Dermatology: Skin is warm, dry and supple bilateral. Medial border left hallux appears to be erythematous with evidence of an ingrowing nail. Pain on palpation noted to the border of the nail fold. Hyperkeratotic lesion present on the sub-fifth MPJ of the left foot. Pain on palpation with a central nucleated core noted. The remaining nails appear unremarkable at this time. There are no open sores, lesions.  Vascular: Dorsalis Pedis artery and Posterior Tibial artery pedal pulses palpable. No lower extremity edema noted.   Neruologic: Grossly intact via light touch bilateral.  Musculoskeletal: Muscular strength within normal limits in all groups bilateral. Normal range of motion noted to all pedal and ankle joints.   Assesement: #1 Paronychia with ingrowing nail medial border left hallux #2 Pain in toe #3 Incurvated nail #4 Porokeratosis sub-fifth MPJ left  Plan of Care:  1.  Patient evaluated.  2. Discussed treatment alternatives and plan of care. Explained nail avulsion procedure and post procedure course to patient. 3. Patient opted for permanent partial nail avulsion medial border left hallux.  4. Prior to procedure, local anesthesia infiltration utilized using 3 ml of a 50:50 mixture of 2% plain lidocaine and 0.5% plain marcaine in a normal hallux block fashion and a betadine prep performed.  5. Partial permanent nail avulsion with chemical matrixectomy performed using [id number] applications of phenol followed by alcohol flush.  6. Light dressing applied. 7. Prescription for Gentamicin cream provided to patient to use daily with a bandage.  8. Excisional debridement of keratotic lesion using a chisel blade was performed without incident. Salinocaine applied and light dressing placed.  9. Recommended OTC corn and callus remover.  10. Return to clinic in 3 weeks.   Edrick Kins, DPM [facility]  Dr. Edrick Kins, [address]                                        [place], [place] [address]                Office [phone]  Fax [phone]

## 2018-08-12 ENCOUNTER — Telehealth: Payer: Self-pay | Admitting: Podiatry

## 2018-08-12 NOTE — Telephone Encounter (Signed)
Patient was seen previously and had a callus removed from the bottom of her foot. The pts husband has been putting drops on the area but says that area of skin seems to be coming up and they wanted to speak with the nurse to discuss what they should do. Please give pt a call

## 2018-08-12 NOTE — Telephone Encounter (Signed)
Dr. Amalia Hailey states pt may stop the salicylic acid to the corn site, keep clean and gently buff the area with a washcloth when bathing, may apply neosporin to the site if bleeding. Otha asked if pt needed to continue the white cream to the toe after soaking in white vinegar and epsom salts. I told Otha just to use either white vinegar or epsom salt, not both and to continue the white cream. Otha asked for the name of the dermatologist Dr. Amalia Hailey told him he used.

## 2018-08-24 ENCOUNTER — Other Ambulatory Visit: Payer: Self-pay

## 2018-08-24 ENCOUNTER — Ambulatory Visit (INDEPENDENT_AMBULATORY_CARE_PROVIDER_SITE_OTHER): Payer: Medicare Other | Admitting: Podiatry

## 2018-08-24 DIAGNOSIS — L600 Ingrowing nail: Secondary | ICD-10-CM | POA: Diagnosis not present

## 2018-08-24 DIAGNOSIS — L989 Disorder of the skin and subcutaneous tissue, unspecified: Secondary | ICD-10-CM

## 2018-08-25 NOTE — Progress Notes (Signed)
   Subjective: 85 year old female presents today status post permanent nail avulsion procedure of the medial border of the left hallux that was performed on 08/01/2018. She is here for follow up evaluation of a callus lesion of the left foot as well. She states the nail and the callus have improved significantly. She denies any pain or modifying factors. Patient is here for further evaluation and treatment.    Past Medical History:  Diagnosis Date  . Arthritis   . Complication of anesthesia   . Diabetes mellitus   . DVT (deep venous thrombosis) ([place]) 2001   left  took Lovenox injections  . Edema   . Hyperlipidemia   . Neuropathy   . Peripheral vascular disease ([place])   . PONV (postoperative nausea and vomiting)   . Thrombophlebitis   . Varicose veins     Objective: Skin is warm, dry and supple. Nail and respective nail fold appears to be healing appropriately. Open wound to the associated nail fold with a granular wound base and moderate amount of fibrotic tissue. Minimal drainage noted. Mild erythema around the periungual region likely due to phenol chemical matricectomy.  Assessment: #1 postop permanent partial nail avulsion medial border left hallux #2 open wound periungual nail fold of respective digit.  #3 Porokeratosis sub-fifth MPJ left - resolved   Plan of care: #1 patient was evaluated  #2 debridement of open wound was performed to the periungual border of the respective toe using a currette. Antibiotic ointment and Band-Aid was applied. #3 patient is to return to clinic on a PRN basis.   Edrick Kins, DPM [facility]  Dr. Edrick Kins, [address]                                        [place], [place] [address]                Office [phone]  Fax [phone]

## 2018-10-13 DIAGNOSIS — Z96653 Presence of artificial knee joint, bilateral: Secondary | ICD-10-CM | POA: Diagnosis not present

## 2018-11-03 DIAGNOSIS — E7800 Pure hypercholesterolemia, unspecified: Secondary | ICD-10-CM | POA: Diagnosis not present

## 2018-11-03 DIAGNOSIS — E02 Subclinical iodine-deficiency hypothyroidism: Secondary | ICD-10-CM | POA: Diagnosis not present

## 2018-11-03 DIAGNOSIS — R5383 Other fatigue: Secondary | ICD-10-CM | POA: Diagnosis not present

## 2018-11-03 DIAGNOSIS — E1165 Type 2 diabetes mellitus with hyperglycemia: Secondary | ICD-10-CM | POA: Diagnosis not present

## 2018-11-03 DIAGNOSIS — E1140 Type 2 diabetes mellitus with diabetic neuropathy, unspecified: Secondary | ICD-10-CM | POA: Diagnosis not present

## 2018-11-03 DIAGNOSIS — I10 Essential (primary) hypertension: Secondary | ICD-10-CM | POA: Diagnosis not present

## 2018-11-03 DIAGNOSIS — N390 Urinary tract infection, site not specified: Secondary | ICD-10-CM | POA: Diagnosis not present

## 2018-12-31 ENCOUNTER — Other Ambulatory Visit: Payer: Self-pay

## 2019-02-05 DIAGNOSIS — R51 Headache: Secondary | ICD-10-CM | POA: Diagnosis not present

## 2019-02-05 DIAGNOSIS — E11319 Type 2 diabetes mellitus with unspecified diabetic retinopathy without macular edema: Secondary | ICD-10-CM | POA: Diagnosis not present

## 2019-02-05 DIAGNOSIS — Z6831 Body mass index (BMI) 31.0-31.9, adult: Secondary | ICD-10-CM | POA: Diagnosis not present

## 2019-02-05 DIAGNOSIS — E1165 Type 2 diabetes mellitus with hyperglycemia: Secondary | ICD-10-CM | POA: Diagnosis not present

## 2019-02-05 DIAGNOSIS — Z299 Encounter for prophylactic measures, unspecified: Secondary | ICD-10-CM | POA: Diagnosis not present

## 2019-02-05 DIAGNOSIS — I739 Peripheral vascular disease, unspecified: Secondary | ICD-10-CM | POA: Diagnosis not present

## 2019-02-12 ENCOUNTER — Encounter: Payer: Self-pay | Admitting: Neurology

## 2019-03-03 DIAGNOSIS — H35353 Cystoid macular degeneration, bilateral: Secondary | ICD-10-CM | POA: Diagnosis not present

## 2019-03-03 DIAGNOSIS — Z7984 Long term (current) use of oral hypoglycemic drugs: Secondary | ICD-10-CM | POA: Diagnosis not present

## 2019-03-03 DIAGNOSIS — Z794 Long term (current) use of insulin: Secondary | ICD-10-CM | POA: Diagnosis not present

## 2019-03-03 DIAGNOSIS — Z961 Presence of intraocular lens: Secondary | ICD-10-CM | POA: Diagnosis not present

## 2019-03-03 DIAGNOSIS — E119 Type 2 diabetes mellitus without complications: Secondary | ICD-10-CM | POA: Diagnosis not present

## 2019-03-03 DIAGNOSIS — H43811 Vitreous degeneration, right eye: Secondary | ICD-10-CM | POA: Diagnosis not present

## 2019-03-03 DIAGNOSIS — H35372 Puckering of macula, left eye: Secondary | ICD-10-CM | POA: Diagnosis not present
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
## 2019-03-16 ENCOUNTER — Ambulatory Visit (INDEPENDENT_AMBULATORY_CARE_PROVIDER_SITE_OTHER): Payer: Medicare Other | Admitting: Neurology

## 2019-03-16 ENCOUNTER — Other Ambulatory Visit: Payer: Self-pay

## 2019-03-16 ENCOUNTER — Encounter: Payer: Self-pay | Admitting: Neurology

## 2019-03-16 VITALS — BP 116/60 | HR 72 | Temp 98.0°F | Ht 64.0 in | Wt 182.0 lb

## 2019-03-16 DIAGNOSIS — R519 Headache, unspecified: Secondary | ICD-10-CM

## 2019-03-16 NOTE — Patient Instructions (Signed)
MRI of brain

## 2019-04-01 ENCOUNTER — Other Ambulatory Visit: Payer: Self-pay

## 2019-04-01 ENCOUNTER — Ambulatory Visit
Admission: RE | Admit: 2019-04-01 | Discharge: 2019-04-01 | Disposition: A | Discharge status: Home / Self Care | Payer: Medicare Other | Source: Ambulatory Visit | Attending: Neurology | Admitting: Neurology

## 2019-04-01 DIAGNOSIS — R519 Headache, unspecified: Secondary | ICD-10-CM

## 2019-04-01 MED ORDER — GADOBENATE DIMEGLUMINE 529 MG/ML IV SOLN
17.0000 mL | Freq: Once | INTRAVENOUS | Status: AC | PRN
  Administered 2019-04-01: 17 mL via INTRAVENOUS

## 2019-04-02 DIAGNOSIS — R519 Headache, unspecified: Secondary | ICD-10-CM | POA: Diagnosis not present

## 2019-04-05 ENCOUNTER — Telehealth: Payer: Self-pay

## 2019-04-05 NOTE — Telephone Encounter (Signed)
Called spoke with patient she was informed of results. 

## 2019-04-05 NOTE — Telephone Encounter (Signed)
-----   Message from Alda Berthold, DO sent at 04/02/2019  4:22 PM EDT ----- Please inform patient that her MRI brain is normal. Thanks.

## 2019-04-27 DIAGNOSIS — E02 Subclinical iodine-deficiency hypothyroidism: Secondary | ICD-10-CM | POA: Diagnosis not present

## 2019-04-27 DIAGNOSIS — Z794 Long term (current) use of insulin: Secondary | ICD-10-CM | POA: Diagnosis not present

## 2019-04-27 DIAGNOSIS — E1165 Type 2 diabetes mellitus with hyperglycemia: Secondary | ICD-10-CM | POA: Diagnosis not present

## 2019-04-27 DIAGNOSIS — I10 Essential (primary) hypertension: Secondary | ICD-10-CM | POA: Diagnosis not present

## 2019-04-27 DIAGNOSIS — E1140 Type 2 diabetes mellitus with diabetic neuropathy, unspecified: Secondary | ICD-10-CM | POA: Diagnosis not present

## 2019-04-27 DIAGNOSIS — E7800 Pure hypercholesterolemia, unspecified: Secondary | ICD-10-CM | POA: Diagnosis not present

## 2019-05-18 DIAGNOSIS — Z7189 Other specified counseling: Secondary | ICD-10-CM | POA: Diagnosis not present

## 2019-05-18 DIAGNOSIS — E1165 Type 2 diabetes mellitus with hyperglycemia: Secondary | ICD-10-CM | POA: Diagnosis not present

## 2019-05-18 DIAGNOSIS — Z1211 Encounter for screening for malignant neoplasm of colon: Secondary | ICD-10-CM | POA: Diagnosis not present

## 2019-05-18 DIAGNOSIS — Z1339 Encounter for screening examination for other mental health and behavioral disorders: Secondary | ICD-10-CM | POA: Diagnosis not present

## 2019-05-18 DIAGNOSIS — Z1331 Encounter for screening for depression: Secondary | ICD-10-CM | POA: Diagnosis not present

## 2019-05-18 DIAGNOSIS — Z299 Encounter for prophylactic measures, unspecified: Secondary | ICD-10-CM | POA: Diagnosis not present

## 2019-05-18 DIAGNOSIS — E1142 Type 2 diabetes mellitus with diabetic polyneuropathy: Secondary | ICD-10-CM | POA: Diagnosis not present

## 2019-05-18 DIAGNOSIS — E7800 Pure hypercholesterolemia, unspecified: Secondary | ICD-10-CM | POA: Diagnosis not present

## 2019-05-18 DIAGNOSIS — Z0000 Encounter for general adult medical examination without abnormal findings: Secondary | ICD-10-CM | POA: Diagnosis not present

## 2019-05-18 DIAGNOSIS — Z6831 Body mass index (BMI) 31.0-31.9, adult: Secondary | ICD-10-CM | POA: Diagnosis not present

## 2019-07-26 DIAGNOSIS — E11319 Type 2 diabetes mellitus with unspecified diabetic retinopathy without macular edema: Secondary | ICD-10-CM | POA: Diagnosis not present

## 2019-07-26 DIAGNOSIS — M542 Cervicalgia: Secondary | ICD-10-CM | POA: Diagnosis not present

## 2019-07-26 DIAGNOSIS — E1165 Type 2 diabetes mellitus with hyperglycemia: Secondary | ICD-10-CM | POA: Diagnosis not present

## 2019-07-26 DIAGNOSIS — Z789 Other specified health status: Secondary | ICD-10-CM | POA: Diagnosis not present

## 2019-07-26 DIAGNOSIS — I739 Peripheral vascular disease, unspecified: Secondary | ICD-10-CM | POA: Diagnosis not present

## 2019-07-26 DIAGNOSIS — Z299 Encounter for prophylactic measures, unspecified: Secondary | ICD-10-CM | POA: Diagnosis not present

## 2019-07-26 DIAGNOSIS — Z6831 Body mass index (BMI) 31.0-31.9, adult: Secondary | ICD-10-CM | POA: Diagnosis not present

## 2019-08-13 DIAGNOSIS — Z23 Encounter for immunization: Secondary | ICD-10-CM | POA: Diagnosis not present

## 2019-08-26 DIAGNOSIS — E1142 Type 2 diabetes mellitus with diabetic polyneuropathy: Secondary | ICD-10-CM | POA: Diagnosis not present

## 2019-08-26 DIAGNOSIS — Z6830 Body mass index (BMI) 30.0-30.9, adult: Secondary | ICD-10-CM | POA: Diagnosis not present

## 2019-08-26 DIAGNOSIS — E782 Mixed hyperlipidemia: Secondary | ICD-10-CM | POA: Diagnosis not present

## 2019-08-26 DIAGNOSIS — G6289 Other specified polyneuropathies: Secondary | ICD-10-CM | POA: Diagnosis not present

## 2019-08-26 DIAGNOSIS — I10 Essential (primary) hypertension: Secondary | ICD-10-CM | POA: Diagnosis not present

## 2019-10-10 DIAGNOSIS — E11649 Type 2 diabetes mellitus with hypoglycemia without coma: Secondary | ICD-10-CM | POA: Diagnosis not present

## 2019-10-27 DIAGNOSIS — E1165 Type 2 diabetes mellitus with hyperglycemia: Secondary | ICD-10-CM | POA: Diagnosis not present

## 2019-10-27 DIAGNOSIS — E7800 Pure hypercholesterolemia, unspecified: Secondary | ICD-10-CM | POA: Diagnosis not present

## 2019-10-27 DIAGNOSIS — E02 Subclinical iodine-deficiency hypothyroidism: Secondary | ICD-10-CM | POA: Diagnosis not present

## 2019-10-27 DIAGNOSIS — E1140 Type 2 diabetes mellitus with diabetic neuropathy, unspecified: Secondary | ICD-10-CM | POA: Diagnosis not present

## 2019-10-27 DIAGNOSIS — I10 Essential (primary) hypertension: Secondary | ICD-10-CM | POA: Diagnosis not present

## 2019-10-27 DIAGNOSIS — Z794 Long term (current) use of insulin: Secondary | ICD-10-CM | POA: Diagnosis not present

## 2020-03-16 DIAGNOSIS — M79602 Pain in left arm: Secondary | ICD-10-CM | POA: Diagnosis not present

## 2020-03-16 DIAGNOSIS — Z96651 Presence of right artificial knee joint: Secondary | ICD-10-CM | POA: Diagnosis not present

## 2020-03-16 DIAGNOSIS — M7051 Other bursitis of knee, right knee: Secondary | ICD-10-CM | POA: Diagnosis not present

## 2020-03-31 DIAGNOSIS — Z23 Encounter for immunization: Secondary | ICD-10-CM | POA: Diagnosis not present

## 2020-05-01 DIAGNOSIS — N390 Urinary tract infection, site not specified: Secondary | ICD-10-CM | POA: Diagnosis not present

## 2020-05-01 DIAGNOSIS — R252 Cramp and spasm: Secondary | ICD-10-CM | POA: Diagnosis not present

## 2020-05-01 DIAGNOSIS — E02 Subclinical iodine-deficiency hypothyroidism: Secondary | ICD-10-CM | POA: Diagnosis not present

## 2020-05-01 DIAGNOSIS — E1165 Type 2 diabetes mellitus with hyperglycemia: Secondary | ICD-10-CM | POA: Diagnosis not present

## 2020-05-01 DIAGNOSIS — Z794 Long term (current) use of insulin: Secondary | ICD-10-CM | POA: Diagnosis not present

## 2020-05-01 DIAGNOSIS — I10 Essential (primary) hypertension: Secondary | ICD-10-CM | POA: Diagnosis not present

## 2020-05-01 DIAGNOSIS — E7800 Pure hypercholesterolemia, unspecified: Secondary | ICD-10-CM | POA: Diagnosis not present

## 2020-05-01 DIAGNOSIS — E1140 Type 2 diabetes mellitus with diabetic neuropathy, unspecified: Secondary | ICD-10-CM | POA: Diagnosis not present

## 2020-05-01 DIAGNOSIS — R202 Paresthesia of skin: Secondary | ICD-10-CM | POA: Diagnosis not present

## 2020-05-15 DIAGNOSIS — E782 Mixed hyperlipidemia: Secondary | ICD-10-CM | POA: Diagnosis not present

## 2020-05-15 DIAGNOSIS — Z9189 Other specified personal risk factors, not elsewhere classified: Secondary | ICD-10-CM | POA: Diagnosis not present

## 2020-05-15 DIAGNOSIS — I10 Essential (primary) hypertension: Secondary | ICD-10-CM | POA: Diagnosis not present

## 2020-05-15 DIAGNOSIS — E559 Vitamin D deficiency, unspecified: Secondary | ICD-10-CM | POA: Diagnosis not present

## 2020-05-15 DIAGNOSIS — Z1329 Encounter for screening for other suspected endocrine disorder: Secondary | ICD-10-CM | POA: Diagnosis not present

## 2020-05-15 DIAGNOSIS — E1142 Type 2 diabetes mellitus with diabetic polyneuropathy: Secondary | ICD-10-CM | POA: Diagnosis not present

## 2020-05-15 DIAGNOSIS — G6289 Other specified polyneuropathies: Secondary | ICD-10-CM | POA: Diagnosis not present

## 2020-05-18 DIAGNOSIS — I10 Essential (primary) hypertension: Secondary | ICD-10-CM | POA: Diagnosis not present

## 2020-05-18 DIAGNOSIS — G6289 Other specified polyneuropathies: Secondary | ICD-10-CM | POA: Diagnosis not present

## 2020-05-18 DIAGNOSIS — E782 Mixed hyperlipidemia: Secondary | ICD-10-CM | POA: Diagnosis not present

## 2020-05-18 DIAGNOSIS — Z0000 Encounter for general adult medical examination without abnormal findings: Secondary | ICD-10-CM | POA: Diagnosis not present

## 2020-05-18 DIAGNOSIS — Z6830 Body mass index (BMI) 30.0-30.9, adult: Secondary | ICD-10-CM | POA: Diagnosis not present

## 2020-05-18 DIAGNOSIS — E1142 Type 2 diabetes mellitus with diabetic polyneuropathy: Secondary | ICD-10-CM | POA: Diagnosis not present

## 2020-05-22 DIAGNOSIS — Z23 Encounter for immunization: Secondary | ICD-10-CM | POA: Diagnosis not present

## 2020-06-01 DIAGNOSIS — I739 Peripheral vascular disease, unspecified: Secondary | ICD-10-CM | POA: Diagnosis not present

## 2020-06-01 DIAGNOSIS — E1140 Type 2 diabetes mellitus with diabetic neuropathy, unspecified: Secondary | ICD-10-CM | POA: Diagnosis not present

## 2020-07-13 ENCOUNTER — Ambulatory Visit (INDEPENDENT_AMBULATORY_CARE_PROVIDER_SITE_OTHER): Payer: Medicare Other | Admitting: Internal Medicine

## 2020-07-13 ENCOUNTER — Ambulatory Visit (HOSPITAL_COMMUNITY)
Admission: RE | Admit: 2020-07-13 | Discharge: 2020-07-13 | Disposition: A | Discharge status: Home / Self Care | Payer: Medicare Other | Source: Ambulatory Visit | Attending: Internal Medicine | Admitting: Internal Medicine

## 2020-07-13 ENCOUNTER — Encounter (INDEPENDENT_AMBULATORY_CARE_PROVIDER_SITE_OTHER): Payer: Self-pay | Admitting: Internal Medicine

## 2020-07-13 ENCOUNTER — Other Ambulatory Visit: Payer: Self-pay

## 2020-07-13 DIAGNOSIS — K5900 Constipation, unspecified: Secondary | ICD-10-CM | POA: Insufficient documentation

## 2020-07-13 DIAGNOSIS — R1319 Other dysphagia: Secondary | ICD-10-CM | POA: Insufficient documentation

## 2020-07-13 MED ORDER — PEG 3350-KCL-NA BICARB-NACL 420 G PO SOLR: 4000.0000 mL | Freq: Once | ORAL | 0 refills | Status: AC

## 2020-07-13 NOTE — Patient Instructions (Signed)
Physician will call with results of abdominal film. To drinking GoLYTELY as directed.

## 2020-07-13 NOTE — Progress Notes (Signed)
Presenting complaint;  Swallowing difficulty and constipation  History of present illness  Patient is 87 year old Caucasian female who is here for scheduled visit accompanied by her husband. I last saw her in December 2011 for high rescreening colonoscopy which revealed small tubular adenoma. Patient states when she called to make an appointment it was for swallowing difficulty but now she has another problem.  She has not had a bowel movement in 5 or 6 days.  Patient states she has had swallowing difficulty primarily with pills and occasionally with solids for the last several weeks.  She has difficulty with Metformin as well as gabapentin.  Her husband breaks Metformin into 2 pieces.  She points to her throat and upper sternal area site of bolus obstruction.  She says she drinks water and pills finally go down.  She swallows 1 pill at a time.  She rarely has heartburn.  She denies nausea vomiting hoarseness or chronic cough.  She does not take any medication for her bone loss.  Her appetite is good and she has not lost any weight recently. Patient states she has been constipated for the last 5 or 6 days.  She has noted some fullness and discomfort in epigastric region since it started.  She took 2 Dulcolax tablets followed by stool softener followed by 2 more tablets of Dulcolax as well as fleets enema but she has not had a bowel movement.  She has been passing flatus.  She has been able to eat her meals.  She feels she is getting enough fiber in her diet.  She denies melena or rectal bleeding.  She has not experienced fever.  She states her baseline is bowel movement daily or every other day and she did not notice change to caliber of stool.  She has not changed her eating habits recently and she did not go on a trip or start a new medication. She says she had blood work by Dr. Gar Ponto few weeks ago including thyroid test and these were all normal.  Patient had normal colonoscopy in 2006 and  more recent exam was in December 2011 with removal of small polyp of splenic flexure and it was a tubular adenoma.  This exam also revealed few diverticula at sigmoid colon.  Current Medications: Outpatient Encounter Medications as of 07/13/2020  Medication Sig  . aspirin EC 81 MG tablet Take by mouth daily.  . Calcium Carbonate-Vitamin D 600-200 MG-UNIT TABS Take 1 tablet by mouth 2 (two) times daily with breakfast and lunch.  . candesartan (ATACAND) 4 MG tablet Take 4 mg by mouth daily.  Marland Kitchen gabapentin (NEURONTIN) 300 MG capsule Take 300 mg by mouth 3 (three) times daily.  Marland Kitchen glucose blood test strip 1 each by Other route as needed. Use as instructed  . insulin aspart (NOVOLOG) 100 UNIT/ML injection Inject 3-4 Units into the skin. This is as needed per sliding scales.  . insulin NPH-regular Human (70-30) 100 UNIT/ML injection 36 units am / 16 units pm  . Lancets (ONETOUCH DELICA PLUS [id number]) MISC USE 1 TO CHECK GLUCOSE 4 TIMES DAILY  . metFORMIN (GLUCOPHAGE) 1000 MG tablet Take 1,000 mg by mouth 2 (two) times daily with a meal.  . Multiple Vitamins-Minerals (PRESERVISION AREDS) CAPS Take by mouth daily.  . Omega-3 Fatty Acids (FISH OIL PO) 2 capsules daily with dinner.  . rosuvastatin (CRESTOR) 5 MG tablet Take 5 mg by mouth every evening.  . loratadine (CLARITIN) 10 MG tablet Take 10 mg by mouth daily. (  Patient not taking: Reported on 07/13/2020)  . polyethylene glycol (MIRALAX / GLYCOLAX) packet Take 17 g by mouth daily. (Patient not taking: Reported on 07/13/2020)  . [DISCONTINUED] gentamicin cream (GARAMYCIN) 0.1 % Apply 1 application topically 2 (two) times daily. (Patient not taking: No sig reported)   No facility-administered encounter medications on file as of 07/13/2020.   Past medical history  Diabetes mellitus since 1996. Peripheral neuropathy secondary to diabetes mellitus. She was treated with Lovenox for superficial phlebitis in 2001. Hyperlipidemia. Surgery on left arm for  fracture 1993. Right knee arthroplasty in February 2012. She had lumbar spine surgery in August 2012. Left knee arthroplasty in June 2014. She has had bilateral cataract extraction. Colonoscopy in 2006 and then in December 2011 as above.  Please note that her records say that she has peripheral vascular disease.  Patient states that she had noninvasive testing and her circulation was normal and her toe numbness is due to neuropathy.  Allergies  Allergies  Allergen Reactions  . Doxycycline Swelling    Eyelids swell    Family history  Father died in his sleep when he was 75 years old. Mother died of CVA in her 46s. She had 1 brother who died of MI at age 41. She had 7 sisters and only one is living.  2 sisters died of colon carcinoma in their 36s.   3 sisters are diabetic and one of them lived to be 6.  Another sister died at 82.   Social history  She has 1 grownup son from first marriage.  First marriage ended in divorce.  She remarried. She has never smoked cigarettes or drank alcohol. She worked at [employer] for 23 years and thereafter she worked at [employer] for another 20 years.  She retired in 1999.  She walks regularly but not since it has been cold.  Physical examination  Blood pressure 139/65, pulse 70, temperature 98.4 F (36.9 C), temperature source Oral, height 5' 5"  (1.651 m), weight 180 lb 3.2 oz (81.7 kg). Patient is alert and in no acute distress. She is wearing a mask. Conjunctiva is pink. Sclera is nonicteric Oropharyngeal mucosa is normal. No neck masses or thyromegaly noted.  Normal-sized lymph nodes in upper neck. Cardiac exam with regular rhythm normal S1 and S2. No murmur or gallop noted. Lungs are clear to auscultation. Abdomen is full.  Symmetrical.  Bowel sounds are normal.  No bruits noted.  On palpation abdomen is soft.  She has mild midepigastric tenderness.  No organomegaly or masses.  Rectal examination reveals normal sphincter  tone.  There is no stool in the rectum.  Mucus was guaiac negative. No LE edema or clubbing noted.  Labs/studies Results:  CBC Latest Ref Rng & Units 11/05/2012 11/04/2012 11/03/2012  WBC 4.0 - 10.5 K/uL 5.6 5.1 8.0  Hemoglobin 12.0 - 15.0 g/dL 11.0(L) 11.3(L) 11.6(L)  Hematocrit 36.0 - 46.0 % 32.2(L) 33.1(L) 33.5(L)  Platelets 150 - 400 K/uL 207 202 221    CMP Latest Ref Rng & Units 11/05/2012 11/04/2012 11/03/2012  Glucose 70 - 99 mg/dL 343(H) 326(H) 241(H)  BUN 6 - 23 mg/dL 17 16 12   Creatinine 0.50 - 1.10 mg/dL 0.67 0.74 0.63  Sodium 135 - 145 mEq/L 133(L) 137 132(L)  Potassium 3.5 - 5.1 mEq/L 4.5 4.2 4.5  Chloride 96 - 112 mEq/L 99 103 98  CO2 19 - 32 mEq/L 26 29 27   Calcium 8.4 - 10.5 mg/dL 8.5 8.8 8.7  Total Protein 6.0 -  8.3 g/dL - - -  Total Bilirubin 0.3 - 1.2 mg/dL - - -  Alkaline Phos 39 - 117 U/L - - -  AST 0 - 37 U/L - - -  ALT 0 - 35 U/L - - -    Hepatic Function Latest Ref Rng & Units 10/23/2012 07/04/2010  Total Protein 6.0 - 8.3 g/dL 7.4 6.6  Albumin 3.5 - 5.2 g/dL 3.7 3.7  AST 0 - 37 U/L 16 18  ALT 0 - 35 U/L 14 15  Alk Phosphatase 39 - 117 U/L 86 58  Total Bilirubin 0.3 - 1.2 mg/dL 0.6 0.7     Assessment:  #1.  Esophageal dysphagia.  She is having problem primarily with large pills.  She has occasional dysphagia for solids.  No prior history of GERD.  No risk factors for esophageal stricture.  I wonder if she has a motility disorder.  Also need to rule out a Zenker's diverticulum.  She will be further evaluated with barium pill esophagogram.  #2.  Recent onset of constipation.  She is not acutely ill.  Bowel sounds are normal.  No stool noted in the rectum on digital exam.  She is not behaving as if she has colonic obstruction.  Family history is significant for colon carcinoma and 2 sisters.  She is high risk.  Last colonoscopy was in December 2011.  TSH reportedly normal.  Recommendations  Flat and upright abdominal film today. Barium pill esophagogram will be  scheduled once constipation resolved. GoLYTELY 1 gallon.  Patient will start when x-rays completed and reviewed. Patient advised to report to emergency room if she develops abdominal pain or vomiting after she takes prep. Would consider colonoscopy at a later date. Request copy of recent blood work from Dr. Olena Heckle office. Office visit in 1 month.

## 2020-07-14 ENCOUNTER — Telehealth (INDEPENDENT_AMBULATORY_CARE_PROVIDER_SITE_OTHER): Payer: Self-pay | Admitting: *Deleted

## 2020-07-14 NOTE — Telephone Encounter (Signed)
Mr. Bath called with a progress report this morning. He states that Mrs. Garmany had a successful outcome. She finished going to the bathroom this morning at 5 am. Toward the end the feces was still not clear so she drank water and then it all came out clear. Her blood sugar this morning was 108 . Mr. Ent states that he gave her a bowl of Cheerios and half a banana this morning. Other than both of them being so sleepy everything is good. They just want to know if there are any further recommendations.

## 2020-07-17 ENCOUNTER — Telehealth (INDEPENDENT_AMBULATORY_CARE_PROVIDER_SITE_OTHER): Payer: Self-pay | Admitting: *Deleted

## 2020-07-17 NOTE — Telephone Encounter (Signed)
Kelly Weaver and her husband talked with me this morning. She shares that she is really weak. She is eating 3 meals a day and drinking 64 oz of water daily. Her BM's are very small and are solid. She states that her BM's are not what she calls normal for her.She reports that she took 3 stool softeners last night. Her stomach feels about the same as it did when she came into office last week.  This morning before breakfast her blood sugar was over 300. She has taken her Metformin and injected 36 units of insulin. For breakfast she had oatmeal with 1/2 banana.  Patient feels that she is to weak to have the Barium Pill Swallow this weak and there is a concern that the barium will constipate her. She would like to wait 1 month,says this may give her time to get stronger and her bowels get back to normal.  Question 1 - Is there a certain diet that she be on? Question 2 - What can be done to make her fell stronger?  Dr.Rehman will be made aware and patient will then be made aware of any recommendations that he may have. Darius Bump made aware to cancel the BPE this week per the patient and her husband's request.

## 2020-07-17 NOTE — Telephone Encounter (Signed)
This will be discussed with Dr.Rehman on Tuesday February 15th. Patient will be called with his recommendation.

## 2020-07-18 ENCOUNTER — Telehealth (INDEPENDENT_AMBULATORY_CARE_PROVIDER_SITE_OTHER): Payer: Self-pay | Admitting: *Deleted

## 2020-07-18 NOTE — Telephone Encounter (Signed)
Patient wanted to R/S BPE at 9:00 on 08/07/20

## 2020-07-18 NOTE — Telephone Encounter (Signed)
cx'd apt for swallow test Thursday please call

## 2020-07-18 NOTE — Telephone Encounter (Signed)
It is all right to postpone barium study for few weeks Regarding constipation she needs to be on a high-fiber diet and try Colace 2 to 3 mg daily.  If it does not work she should stop Colace and begin Peri-Colace 1 tablet or capsule twice daily

## 2020-07-19 NOTE — Telephone Encounter (Signed)
This was discussed with Dr.Rehman.  The patient should be on a high fiber diet, she needs to make sure that she eats regularly. She may take the Colace or take the Pericolace by mouth twice a day. It may take time for the patient to regain her strength. Her blood sugars are not under control.   As for the patient and husband's concern about the BPE being put on hold, Dr.Rehman states that it will be fine to post phone it for now.   Patient was also made aware that Dr. Laural Golden had received the lab work from her PCP. They were normal except A1C 8.8. She is being followed by PCP regarding her elevated labs.  Patient was called and made aware. She and her husband verbalized understanding.

## 2020-07-19 NOTE — Telephone Encounter (Signed)
Patient was called back as I forgot to share with her and her husband that Dr.Rehman requested that she keep a stool diary until her next office visit. This should include the number of times she goes to the bathroom and the consistency of the stool. I did share this with Mr. Auvil. He verbalized understanding.

## 2020-07-20 ENCOUNTER — Ambulatory Visit (HOSPITAL_COMMUNITY): Payer: Medicare Other

## 2020-07-20 ENCOUNTER — Encounter (INDEPENDENT_AMBULATORY_CARE_PROVIDER_SITE_OTHER): Payer: Self-pay

## 2020-07-20 NOTE — Telephone Encounter (Signed)
Both the patient and her husband have been made aware. May refer to previous telephone encounters.

## 2020-07-25 ENCOUNTER — Telehealth (INDEPENDENT_AMBULATORY_CARE_PROVIDER_SITE_OTHER): Payer: Self-pay | Admitting: Internal Medicine

## 2020-07-25 NOTE — Telephone Encounter (Signed)
I called the patient and she states she would rather speak with Thomas Hoff, Lpn for Dr. Laural Golden, when I asked if there was something I could help her with.

## 2020-07-25 NOTE — Telephone Encounter (Signed)
Patient would like someone to call her back today - ph# [phone]

## 2020-07-26 NOTE — Telephone Encounter (Signed)
Patient was called and this went to her voicemail, message was left.

## 2020-07-27 NOTE — Telephone Encounter (Signed)
Will contact patient when barium study completed

## 2020-07-27 NOTE — Telephone Encounter (Signed)
Talked with the patient. She states that she did start going to the bathroom and now she is back to going to the bathroom normal every day. She is taking 3 stool softeners at bedtime.  Patient is having her BPE on August 07 2020  and she has a follow up appointment with Dr.REhman on  August 09 2020.

## 2020-08-07 ENCOUNTER — Ambulatory Visit (HOSPITAL_COMMUNITY)
Admission: RE | Admit: 2020-08-07 | Discharge: 2020-08-07 | Disposition: A | Discharge status: Home / Self Care | Payer: Medicare Other | Source: Ambulatory Visit | Attending: Internal Medicine | Admitting: Internal Medicine

## 2020-08-07 ENCOUNTER — Other Ambulatory Visit: Payer: Self-pay

## 2020-08-07 DIAGNOSIS — R1310 Dysphagia, unspecified: Secondary | ICD-10-CM | POA: Diagnosis not present

## 2020-08-07 DIAGNOSIS — K224 Dyskinesia of esophagus: Secondary | ICD-10-CM | POA: Diagnosis not present

## 2020-08-07 DIAGNOSIS — R1319 Other dysphagia: Secondary | ICD-10-CM | POA: Insufficient documentation

## 2020-08-07 DIAGNOSIS — K449 Diaphragmatic hernia without obstruction or gangrene: Secondary | ICD-10-CM | POA: Diagnosis not present

## 2020-08-09 ENCOUNTER — Ambulatory Visit (INDEPENDENT_AMBULATORY_CARE_PROVIDER_SITE_OTHER): Payer: Medicare Other | Admitting: Internal Medicine

## 2020-08-10 ENCOUNTER — Ambulatory Visit (INDEPENDENT_AMBULATORY_CARE_PROVIDER_SITE_OTHER): Payer: Medicare Other | Admitting: Internal Medicine

## 2020-08-10 ENCOUNTER — Other Ambulatory Visit: Payer: Self-pay

## 2020-08-10 ENCOUNTER — Encounter (INDEPENDENT_AMBULATORY_CARE_PROVIDER_SITE_OTHER): Payer: Self-pay | Admitting: Internal Medicine

## 2020-08-10 VITALS — BP 129/73 | HR 66 | Temp 98.6°F | Ht 65.0 in | Wt 178.1 lb

## 2020-08-10 DIAGNOSIS — K5900 Constipation, unspecified: Secondary | ICD-10-CM | POA: Diagnosis not present

## 2020-08-10 DIAGNOSIS — R1319 Other dysphagia: Secondary | ICD-10-CM | POA: Diagnosis not present

## 2020-08-10 NOTE — Progress Notes (Addendum)
Presenting complaint;  Follow for dysphagia.  Database and subjective:  Patient is 87 year old Caucasian female who was seen on 07/13/2018 primarily for esophageal dysphagia but she developed constipation just prior to our visit.  Abdominal films were obtained on the day of her visit revealing large amount of stool throughout the colon consistent with constipation but there was no evidence of obstruction.  Patient was advised to drink GoLYTELY 1 gallon resulting in amelioration of her constipation. I felt her dysphagia may be due to esophageal motility disorder.  She return for barium study last week which raised the possibility of motility disorder.  Barium pill passed from oral cavity to stomach without any delay. Patient states she is watching her diet.  She is stool softener daily and her bowels are back to normal.  She denies abdominal pain or rectal bleeding.  Her appetite is good.  She remains with dysphagia to pills and solids.  She denies heartburn.  She has not had any episode of food impaction.  She is very worried about the symptoms and desires further evaluation.  Her biggest concern is that it may get worse.  Her weight is down by 2 pounds.   Current Medications: Outpatient Encounter Medications as of 08/10/2020  Medication Sig   aspirin EC 81 MG tablet Take by mouth daily.   Calcium Carbonate-Vitamin D 600-200 MG-UNIT TABS Take 1 tablet by mouth 2 (two) times daily with breakfast and lunch.   candesartan (ATACAND) 4 MG tablet Take 4 mg by mouth daily.   gabapentin (NEURONTIN) 300 MG capsule Take 300 mg by mouth 3 (three) times daily.   glucose blood test strip 1 each by Other route as needed. Use as instructed   insulin aspart (NOVOLOG) 100 UNIT/ML injection Inject 3-4 Units into the skin. This is as needed per sliding scales.   insulin NPH-regular Human (70-30) 100 UNIT/ML injection 36 units am / 16 units pm   Lancets (ONETOUCH DELICA PLUS [id number]) MISC USE 1 TO CHECK GLUCOSE 4  TIMES DAILY   loratadine (CLARITIN) 10 MG tablet Take 10 mg by mouth daily.   metFORMIN (GLUCOPHAGE) 1000 MG tablet Take 1,000 mg by mouth 2 (two) times daily with a meal.   Multiple Vitamins-Minerals (PRESERVISION AREDS) CAPS Take 1 capsule by mouth daily.   Omega-3 Fatty Acids (FISH OIL PO) 2 capsules daily with dinner.   rosuvastatin (CRESTOR) 5 MG tablet Take 5 mg by mouth every evening.   No facility-administered encounter medications on file as of 08/10/2020.     Objective: Blood pressure 129/73, pulse 66, temperature 98.6 F (37 C), temperature source Oral, height 5\' 5"  (1.651 m), weight 178 lb 1.3 oz (80.8 kg). Patient is alert and in no acute distress. She is wearing a mask. Conjunctiva is pink. Sclera is nonicteric Oropharyngeal mucosa is normal. No neck masses or thyromegaly noted. Cardiac exam with regular rhythm normal S1 and S2. No murmur or gallop noted. Lungs are clear to auscultation. Abdomen is full but soft and nontender with organomegaly or masses. No LE edema or clubbing noted.  Labs/studies Results:  Barium pill esophagogram on 08/07/2020 Small sliding hiatal hernia decrease esophageal peristalsis and distal half of esophagus with tertiary contractions.  No stricture or ulceration.  No Zenker's diverticulum.  Barium pill passed from oral cavity into the stomach without any delay.  Assessment:  #1.  Esophageal dysphagia.  Barium study suggests motility disorder.  She would benefit from esophagogastroduodenoscopy with esophageal dilation as well as esophageal biopsy.  If she  does not respond to esophageal dilation would consider manometry.  #2.  Constipation.  She is doing well with dietary measures and stool softener.  She needs to stay on top of her bowel habits and should not go more than 1 day without a bowel movement.  She can use glycerin or Dulcolax suppository on as-needed basis.  Plan:  Esophagogastroduodenoscopy with esophageal dilation and biopsy in  near future. Office visit in 3 months.

## 2020-08-10 NOTE — Patient Instructions (Signed)
Esophagogastroduodenoscopy with esophageal dilation and esophageal biopsy to be scheduled in near future Can use glycerin or Dulcolax suppository if you go more than 1 day without a bowel movement.

## 2020-08-11 ENCOUNTER — Other Ambulatory Visit (INDEPENDENT_AMBULATORY_CARE_PROVIDER_SITE_OTHER): Payer: Self-pay

## 2020-08-11 ENCOUNTER — Encounter (INDEPENDENT_AMBULATORY_CARE_PROVIDER_SITE_OTHER): Payer: Self-pay

## 2020-08-11 DIAGNOSIS — R1319 Other dysphagia: Secondary | ICD-10-CM

## 2020-08-11 DIAGNOSIS — K5900 Constipation, unspecified: Secondary | ICD-10-CM

## 2020-08-15 ENCOUNTER — Other Ambulatory Visit: Payer: Self-pay

## 2020-08-15 ENCOUNTER — Other Ambulatory Visit (HOSPITAL_COMMUNITY)
Admission: RE | Admit: 2020-08-15 | Discharge: 2020-08-15 | Disposition: A | Discharge status: Home / Self Care | Payer: Medicare Other | Source: Ambulatory Visit | Attending: Internal Medicine | Admitting: Internal Medicine

## 2020-08-15 DIAGNOSIS — Z01812 Encounter for preprocedural laboratory examination: Secondary | ICD-10-CM | POA: Insufficient documentation

## 2020-08-15 DIAGNOSIS — R1319 Other dysphagia: Secondary | ICD-10-CM

## 2020-08-15 DIAGNOSIS — R109 Unspecified abdominal pain: Secondary | ICD-10-CM | POA: Diagnosis not present

## 2020-08-15 DIAGNOSIS — E1110 Type 2 diabetes mellitus with ketoacidosis without coma: Secondary | ICD-10-CM | POA: Diagnosis not present

## 2020-08-15 DIAGNOSIS — E113219 Type 2 diabetes mellitus with mild nonproliferative diabetic retinopathy with macular edema, unspecified eye: Secondary | ICD-10-CM | POA: Diagnosis not present

## 2020-08-15 DIAGNOSIS — Z20822 Contact with and (suspected) exposure to covid-19: Secondary | ICD-10-CM | POA: Diagnosis not present

## 2020-08-15 DIAGNOSIS — I10 Essential (primary) hypertension: Secondary | ICD-10-CM | POA: Diagnosis not present

## 2020-08-15 DIAGNOSIS — E785 Hyperlipidemia, unspecified: Secondary | ICD-10-CM | POA: Diagnosis not present

## 2020-08-15 DIAGNOSIS — E1010 Type 1 diabetes mellitus with ketoacidosis without coma: Secondary | ICD-10-CM | POA: Diagnosis not present

## 2020-08-15 DIAGNOSIS — K5900 Constipation, unspecified: Secondary | ICD-10-CM

## 2020-08-15 DIAGNOSIS — E1151 Type 2 diabetes mellitus with diabetic peripheral angiopathy without gangrene: Secondary | ICD-10-CM | POA: Diagnosis not present

## 2020-08-16 LAB — SARS CORONAVIRUS 2 (TAT 6-24 HRS): SARS Coronavirus 2: NEGATIVE

## 2020-08-17 ENCOUNTER — Ambulatory Visit (HOSPITAL_COMMUNITY)
Admission: RE | Admit: 2020-08-17 | Discharge: 2020-08-17 | Disposition: A | Discharge status: Home / Self Care | Payer: Medicare Other | Source: Home / Self Care | Attending: Internal Medicine | Admitting: Internal Medicine

## 2020-08-17 ENCOUNTER — Encounter (HOSPITAL_COMMUNITY): Payer: Self-pay | Admitting: Internal Medicine

## 2020-08-17 ENCOUNTER — Encounter (HOSPITAL_COMMUNITY)
Admission: RE | Disposition: A | Discharge status: Home / Self Care | Payer: Self-pay | Source: Home / Self Care | Attending: Internal Medicine

## 2020-08-17 ENCOUNTER — Other Ambulatory Visit: Payer: Self-pay

## 2020-08-17 DIAGNOSIS — K5900 Constipation, unspecified: Secondary | ICD-10-CM

## 2020-08-17 DIAGNOSIS — Z79899 Other long term (current) drug therapy: Secondary | ICD-10-CM | POA: Insufficient documentation

## 2020-08-17 DIAGNOSIS — Z881 Allergy status to other antibiotic agents status: Secondary | ICD-10-CM | POA: Insufficient documentation

## 2020-08-17 DIAGNOSIS — K269 Duodenal ulcer, unspecified as acute or chronic, without hemorrhage or perforation: Secondary | ICD-10-CM | POA: Insufficient documentation

## 2020-08-17 DIAGNOSIS — Z833 Family history of diabetes mellitus: Secondary | ICD-10-CM | POA: Insufficient documentation

## 2020-08-17 DIAGNOSIS — K3189 Other diseases of stomach and duodenum: Secondary | ICD-10-CM | POA: Insufficient documentation

## 2020-08-17 DIAGNOSIS — E1151 Type 2 diabetes mellitus with diabetic peripheral angiopathy without gangrene: Secondary | ICD-10-CM | POA: Insufficient documentation

## 2020-08-17 DIAGNOSIS — Z86718 Personal history of other venous thrombosis and embolism: Secondary | ICD-10-CM | POA: Insufficient documentation

## 2020-08-17 DIAGNOSIS — Z7982 Long term (current) use of aspirin: Secondary | ICD-10-CM | POA: Insufficient documentation

## 2020-08-17 DIAGNOSIS — R1314 Dysphagia, pharyngoesophageal phase: Secondary | ICD-10-CM | POA: Insufficient documentation

## 2020-08-17 DIAGNOSIS — Z794 Long term (current) use of insulin: Secondary | ICD-10-CM | POA: Insufficient documentation

## 2020-08-17 DIAGNOSIS — R1319 Other dysphagia: Secondary | ICD-10-CM

## 2020-08-17 HISTORY — PX: ESOPHAGEAL DILATION: SHX303

## 2020-08-17 HISTORY — PX: ESOPHAGOGASTRODUODENOSCOPY: SHX5428

## 2020-08-17 HISTORY — PX: BIOPSY: SHX5522

## 2020-08-17 LAB — GLUCOSE, CAPILLARY
Glucose-Capillary: 336 mg/dL — ABNORMAL HIGH (ref 70–99)
Glucose-Capillary: 340 mg/dL — ABNORMAL HIGH (ref 70–99)
Glucose-Capillary: 287 mg/dL — ABNORMAL HIGH (ref 70–99)

## 2020-08-17 SURGERY — EGD (ESOPHAGOGASTRODUODENOSCOPY)
Anesthesia: Moderate Sedation

## 2020-08-17 MED ORDER — MIDAZOLAM HCL 5 MG/5ML IJ SOLN
INTRAMUSCULAR | Status: DC | PRN
  Administered 2020-08-17: 1 mg via INTRAVENOUS
  Administered 2020-08-17: 2 mg via INTRAVENOUS
  Administered 2020-08-17: 1 mg via INTRAVENOUS

## 2020-08-17 MED ORDER — INSULIN ASPART 100 UNIT/ML ~~LOC~~ SOLN
5.0000 [IU] | Freq: Once | SUBCUTANEOUS | Status: AC
  Administered 2020-08-17: 5 [IU] via SUBCUTANEOUS
  Filled 2020-08-17: qty 0.05

## 2020-08-17 MED ORDER — MEPERIDINE HCL 50 MG/ML IJ SOLN
INTRAMUSCULAR | Status: DC | PRN
  Administered 2020-08-17: 10 mg via INTRAVENOUS
  Administered 2020-08-17: 20 mg via INTRAVENOUS
  Administered 2020-08-17: 10 mg via INTRAVENOUS

## 2020-08-17 MED ORDER — MEPERIDINE HCL 50 MG/ML IJ SOLN
INTRAMUSCULAR | Status: AC
  Filled 2020-08-17: qty 1

## 2020-08-17 MED ORDER — LIDOCAINE VISCOUS HCL 2 % MT SOLN
OROMUCOSAL | Status: DC | PRN
  Administered 2020-08-17: 4 mL via OROMUCOSAL

## 2020-08-17 MED ORDER — SODIUM CHLORIDE 0.9 % IV SOLN: INTRAVENOUS | Status: DC

## 2020-08-17 MED ORDER — STERILE WATER FOR IRRIGATION IR SOLN: Status: DC | PRN

## 2020-08-17 MED ORDER — LIDOCAINE VISCOUS HCL 2 % MT SOLN
OROMUCOSAL | Status: AC
  Filled 2020-08-17: qty 15

## 2020-08-17 MED ORDER — MIDAZOLAM HCL 5 MG/5ML IJ SOLN
INTRAMUSCULAR | Status: AC
  Filled 2020-08-17: qty 10

## 2020-08-17 NOTE — Discharge Instructions (Signed)
Resume aspirin on 08/18/2020. Resume other medications and diet as before.  Remember to always take aspirin with food or snack. No driving for 24 hours. Physician will call with biopsy results.      Upper Endoscopy, Adult, Care After This sheet gives you information about how to care for yourself after your procedure. Your health care provider may also give you more specific instructions. If you have problems or questions, contact your health care provider. What can I expect after the procedure? After the procedure, it is common to have:  A sore throat.  Mild stomach pain or discomfort.  Bloating.  Nausea. Follow these instructions at home:  Follow instructions from your health care provider about what to eat or drink after your procedure.  Return to your normal activities as told by your health care provider. Ask your health care provider what activities are safe for you.  Take over-the-counter and prescription medicines only as told by your health care provider.  If you were given a sedative during the procedure, it can affect you for several hours. Do not drive or operate machinery until your health care provider says that it is safe.  Keep all follow-up visits as told by your health care provider. This is important.   Contact a health care provider if you have:  A sore throat that lasts longer than one day.  Trouble swallowing. Get help right away if:  You vomit blood or your vomit looks like coffee grounds.  You have: ? A fever. ? Bloody, black, or tarry stools. ? A severe sore throat or you cannot swallow. ? Difficulty breathing. ? Severe pain in your chest or abdomen. Summary  After the procedure, it is common to have a sore throat, mild stomach discomfort, bloating, and nausea.  If you were given a sedative during the procedure, it can affect you for several hours. Do not drive or operate machinery until your health care provider says that it is  safe.  Follow instructions from your health care provider about what to eat or drink after your procedure.  Return to your normal activities as told by your health care provider. This information is not intended to replace advice given to you by your health care provider. Make sure you discuss any questions you have with your health care provider. Document Revised: 05/18/2019 Document Reviewed: 10/20/2017 Elsevier Patient Education  2021 [employer].
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
## 2020-08-17 NOTE — Progress Notes (Signed)
Patient's blood sugar 340. Dr. Laural Golden notified. Orders received and carried out.

## 2020-08-17 NOTE — Op Note (Signed)
[facility] Patient Name: Kelly Weaver Procedure Date: 08/17/2020 10:06 AM MRN: [id number] Date of Birth: 12/28/1933 Attending MD: Hildred Laser , MD CSN: [id number] Age: 87 Admit Type: Outpatient Procedure:                Upper GI endoscopy Indications:              Esophageal dysphagia Providers:                Hildred Laser, MD, Jeanann Lewandowsky. Sharon Seller, RN, Nelma Rothman, Technician Referring MD:             Mitzie Na. Quillian Quince, MD Medicines:                Lidocaine jelly, Meperidine 40 mg IV, Midazolam 4                            mg IV Complications:            No immediate complications. Estimated Blood Loss:     Estimated blood loss was minimal. Procedure:                Pre-Anesthesia Assessment:                           - Prior to the procedure, a History and Physical                            was performed, and patient medications and                            allergies were reviewed. The patient's tolerance of                            previous anesthesia was also reviewed. The risks                            and benefits of the procedure and the sedation                            options and risks were discussed with the patient.                            All questions were answered, and informed consent                            was obtained. Prior Anticoagulants: The patient has                            taken no previous anticoagulant or antiplatelet                            agents except for aspirin. ASA Grade Assessment: II                            -  A patient with mild systemic disease. After                            reviewing the risks and benefits, the patient was                            deemed in satisfactory condition to undergo the                            procedure.                           After obtaining informed consent, the endoscope was                            passed under direct vision. Throughout the                             procedure, the patient's blood pressure, pulse, and                            oxygen saturations were monitored continuously. The                            [id number]) was introduced through the mouth,                            and advanced to the second part of duodenum. The                            upper GI endoscopy was accomplished without                            difficulty. The patient tolerated the procedure                            well. Scope In: 10:44:04 AM Scope Out: 10:57:50 AM Total Procedure Duration: 0 hours 13 minutes 46 seconds  Findings:      The hypopharynx was normal.      The examined esophagus was normal.      The Z-line was regular and was found 38 cm from the incisors.      No endoscopic abnormality was evident in the esophagus to explain the       patient's complaint of dysphagia. It was decided, however, to proceed       with dilation of the entire esophagus. The scope was withdrawn. Dilation       was performed with a Maloney dilator with no resistance at 29 Fr. The       dilation site was examined following endoscope reinsertion and showed no       change and no bleeding, mucosal tear or perforation. Biopsies were taken       with a cold forceps for histology. The pathology specimen was placed       into Bottle Number 2.      Multiple erosions with no stigmata of recent bleeding were found in  the       gastric antrum and in the prepyloric region of the stomach. Biopsies       were taken with a cold forceps for histology. The pathology specimen was       placed into Bottle Number 1.      The exam of the stomach was otherwise normal.      A few erosions without bleeding were found in the duodenal bulb.      The second portion of the duodenum was normal. Impression:               - Normal hypopharynx.                           - Normal esophagus.                           - Z-line regular, 38 cm from the incisors.                            - No endoscopic esophageal abnormality to explain                            patient's dysphagia. Esophagus dilated. Dilated.                            Biopsied.                           - Erosive gastropathy with no stigmata of recent                            bleeding. Biopsied.                           - Duodenal erosions without bleeding.                           - Normal second portion of the duodenum. Moderate Sedation:      Moderate (conscious) sedation was administered by the endoscopy nurse       and supervised by the endoscopist. The following parameters were       monitored: oxygen saturation, heart rate, blood pressure, CO2       capnography and response to care. Total physician intraservice time was       18 minutes. Recommendation:           - Patient has a contact number available for                            emergencies. The signs and symptoms of potential                            delayed complications were discussed with the                            patient. Return to normal activities tomorrow.  Written discharge instructions were provided to the                            patient.                           - Resume previous diet today.                           - Continue present medications.                           - No aspirin, ibuprofen, naproxen, or other                            non-steroidal anti-inflammatory drugs for 1 day.                           - Await pathology results. Procedure Code(s):        --- Professional ---                           [phone], Esophagogastroduodenoscopy, flexible,                            transoral; with biopsy, single or multiple                           43450, Dilation of esophagus, by unguided sound or                            bougie, single or multiple passes                           G0500, Moderate sedation services provided by the                            same  physician or other qualified health care                            professional performing a gastrointestinal                            endoscopic service that sedation supports,                            requiring the presence of an independent trained                            observer to assist in the monitoring of the                            patient's level of consciousness and physiological                            status; initial 15 minutes of intra-service time;  patient age 51 years or older (additional time may                            be reported with [phone], as appropriate) Diagnosis Code(s):        --- Professional ---                           K31.89, Other diseases of stomach and duodenum                           K26.9, Duodenal ulcer, unspecified as acute or                            chronic, without hemorrhage or perforation                           R13.14, Dysphagia, pharyngoesophageal phase CPT copyright 2019 American Medical Association. All rights reserved. The codes documented in this report are preliminary and upon coder review may  be revised to meet current compliance requirements. Hildred Laser, MD Hildred Laser, MD 08/17/2020 11:07:47 AM This report has been signed electronically. Number of Addenda: 0

## 2020-08-18 ENCOUNTER — Other Ambulatory Visit: Payer: Self-pay

## 2020-08-18 ENCOUNTER — Encounter (HOSPITAL_COMMUNITY): Payer: Self-pay | Admitting: Emergency Medicine

## 2020-08-18 ENCOUNTER — Inpatient Hospital Stay (HOSPITAL_COMMUNITY)
Admission: EM | Admit: 2020-08-18 | Discharge: 2020-08-20 | DRG: 639 | Disposition: A | Discharge status: Home / Self Care | Payer: Medicare Other | Attending: Internal Medicine | Admitting: Internal Medicine

## 2020-08-18 ENCOUNTER — Emergency Department (HOSPITAL_COMMUNITY): Payer: Medicare Other

## 2020-08-18 DIAGNOSIS — Z96652 Presence of left artificial knee joint: Secondary | ICD-10-CM | POA: Diagnosis present

## 2020-08-18 DIAGNOSIS — Z885 Allergy status to narcotic agent status: Secondary | ICD-10-CM

## 2020-08-18 DIAGNOSIS — Z794 Long term (current) use of insulin: Secondary | ICD-10-CM | POA: Diagnosis not present

## 2020-08-18 DIAGNOSIS — E1151 Type 2 diabetes mellitus with diabetic peripheral angiopathy without gangrene: Secondary | ICD-10-CM | POA: Diagnosis present

## 2020-08-18 DIAGNOSIS — Z7982 Long term (current) use of aspirin: Secondary | ICD-10-CM | POA: Diagnosis not present

## 2020-08-18 DIAGNOSIS — Z20822 Contact with and (suspected) exposure to covid-19: Secondary | ICD-10-CM | POA: Diagnosis present

## 2020-08-18 DIAGNOSIS — Z79899 Other long term (current) drug therapy: Secondary | ICD-10-CM | POA: Diagnosis not present

## 2020-08-18 DIAGNOSIS — E113219 Type 2 diabetes mellitus with mild nonproliferative diabetic retinopathy with macular edema, unspecified eye: Secondary | ICD-10-CM | POA: Diagnosis present

## 2020-08-18 DIAGNOSIS — Z86718 Personal history of other venous thrombosis and embolism: Secondary | ICD-10-CM

## 2020-08-18 DIAGNOSIS — E1110 Type 2 diabetes mellitus with ketoacidosis without coma: Principal | ICD-10-CM | POA: Diagnosis present

## 2020-08-18 DIAGNOSIS — E1010 Type 1 diabetes mellitus with ketoacidosis without coma: Secondary | ICD-10-CM | POA: Diagnosis not present

## 2020-08-18 DIAGNOSIS — Z881 Allergy status to other antibiotic agents status: Secondary | ICD-10-CM

## 2020-08-18 DIAGNOSIS — I10 Essential (primary) hypertension: Secondary | ICD-10-CM | POA: Diagnosis not present

## 2020-08-18 DIAGNOSIS — Z833 Family history of diabetes mellitus: Secondary | ICD-10-CM | POA: Diagnosis not present

## 2020-08-18 DIAGNOSIS — E785 Hyperlipidemia, unspecified: Secondary | ICD-10-CM | POA: Diagnosis present

## 2020-08-18 DIAGNOSIS — E1165 Type 2 diabetes mellitus with hyperglycemia: Secondary | ICD-10-CM | POA: Diagnosis not present

## 2020-08-18 DIAGNOSIS — R1310 Dysphagia, unspecified: Secondary | ICD-10-CM | POA: Diagnosis not present

## 2020-08-18 DIAGNOSIS — R109 Unspecified abdominal pain: Secondary | ICD-10-CM | POA: Diagnosis not present

## 2020-08-18 LAB — URINALYSIS, ROUTINE W REFLEX MICROSCOPIC
Bacteria, UA: NONE SEEN
Bilirubin Urine: NEGATIVE
Glucose, UA: >=500 mg/dL — AB
Hgb urine dipstick: NEGATIVE
Ketones, ur: 80 mg/dL — AB
Leukocytes,Ua: NEGATIVE
Nitrite: NEGATIVE
Protein, ur: NEGATIVE mg/dL
Specific Gravity, Urine: 1.026 (ref 1.005–1.030)
pH: 5 (ref 5.0–8.0)

## 2020-08-18 LAB — BASIC METABOLIC PANEL
Anion gap: 18 — ABNORMAL HIGH (ref 5–15)
Anion gap: 10 (ref 5–15)
BUN: 20 mg/dL (ref 8–23)
BUN: 19 mg/dL (ref 8–23)
CO2: 20 mmol/L — ABNORMAL LOW (ref 22–32)
CO2: 23 mmol/L (ref 22–32)
Calcium: 9.3 mg/dL (ref 8.9–10.3)
Calcium: 9 mg/dL (ref 8.9–10.3)
Chloride: 91 mmol/L — ABNORMAL LOW (ref 98–111)
Chloride: 98 mmol/L (ref 98–111)
Creatinine, Ser: 1 mg/dL (ref 0.44–1.00)
Creatinine, Ser: 0.75 mg/dL (ref 0.44–1.00)
GFR, Estimated: 56 mL/min — ABNORMAL LOW (ref >60)
GFR, Estimated: >60 mL/min (ref >60)
Glucose, Bld: 539 mg/dL (ref 70–99)
Glucose, Bld: 167 mg/dL — ABNORMAL HIGH (ref 70–99)
Potassium: 5 mmol/L (ref 3.5–5.1)
Potassium: 4.2 mmol/L (ref 3.5–5.1)
Sodium: 129 mmol/L — ABNORMAL LOW (ref 135–145)
Sodium: 131 mmol/L — ABNORMAL LOW (ref 135–145)

## 2020-08-18 LAB — CBC
HCT: 40.7 % (ref 36.0–46.0)
Hemoglobin: 13.4 g/dL (ref 12.0–15.0)
MCH: 30.8 pg (ref 26.0–34.0)
MCHC: 32.9 g/dL (ref 30.0–36.0)
MCV: 93.6 fL (ref 80.0–100.0)
Platelets: 290 10*3/uL (ref 150–400)
RBC: 4.35 MIL/uL (ref 3.87–5.11)
RDW: 13.2 % (ref 11.5–15.5)
WBC: 11.2 10*3/uL — ABNORMAL HIGH (ref 4.0–10.5)
nRBC: 0 % (ref 0.0–0.2)

## 2020-08-18 LAB — GLUCOSE, CAPILLARY
Glucose-Capillary: 193 mg/dL — ABNORMAL HIGH (ref 70–99)
Glucose-Capillary: 155 mg/dL — ABNORMAL HIGH (ref 70–99)
Glucose-Capillary: 141 mg/dL — ABNORMAL HIGH (ref 70–99)
Glucose-Capillary: 140 mg/dL — ABNORMAL HIGH (ref 70–99)
Glucose-Capillary: 120 mg/dL — ABNORMAL HIGH (ref 70–99)

## 2020-08-18 LAB — HEMOGLOBIN A1C
Hgb A1c MFr Bld: 8.4 % — ABNORMAL HIGH (ref 4.8–5.6)
Mean Plasma Glucose: 194.38 mg/dL

## 2020-08-18 LAB — BETA-HYDROXYBUTYRIC ACID
Beta-Hydroxybutyric Acid: 3.52 mmol/L — ABNORMAL HIGH (ref 0.05–0.27)
Beta-Hydroxybutyric Acid: 0.64 mmol/L — ABNORMAL HIGH (ref 0.05–0.27)

## 2020-08-18 LAB — CBG MONITORING, ED
Glucose-Capillary: 526 mg/dL (ref 70–99)
Glucose-Capillary: 394 mg/dL — ABNORMAL HIGH (ref 70–99)
Glucose-Capillary: 336 mg/dL — ABNORMAL HIGH (ref 70–99)
Glucose-Capillary: 209 mg/dL — ABNORMAL HIGH (ref 70–99)

## 2020-08-18 LAB — SURGICAL PATHOLOGY

## 2020-08-18 LAB — BLOOD GAS, VENOUS
Acid-base deficit: 5.9 mmol/L — ABNORMAL HIGH (ref 0.0–2.0)
Bicarbonate: 19.2 mmol/L — ABNORMAL LOW (ref 20.0–28.0)
FIO2: 21
O2 Saturation: 59.4 %
Patient temperature: 98.6
pCO2, Ven: 38.5 mmHg — ABNORMAL LOW (ref 44.0–60.0)
pH, Ven: 7.319 (ref 7.250–7.430)
pO2, Ven: 36 mmHg (ref 32.0–45.0)

## 2020-08-18 LAB — MRSA PCR SCREENING: MRSA by PCR: NEGATIVE

## 2020-08-18 MED ORDER — POTASSIUM CHLORIDE 10 MEQ/100ML IV SOLN
10.0000 meq | INTRAVENOUS | Status: AC
  Administered 2020-08-18 (×2): 10 meq via INTRAVENOUS
  Filled 2020-08-18 (×2): qty 100

## 2020-08-18 MED ORDER — CHLORHEXIDINE GLUCONATE CLOTH 2 % EX PADS
6.0000 | MEDICATED_PAD | Freq: Every day | CUTANEOUS | Status: DC
  Administered 2020-08-18: 6 via TOPICAL

## 2020-08-18 MED ORDER — ONDANSETRON HCL 4 MG/2ML IJ SOLN
4.0000 mg | Freq: Four times a day (QID) | INTRAMUSCULAR | Status: DC | PRN
  Administered 2020-08-18: 4 mg via INTRAVENOUS
  Filled 2020-08-18: qty 2

## 2020-08-18 MED ORDER — LACTATED RINGERS IV SOLN: INTRAVENOUS | Status: DC

## 2020-08-18 MED ORDER — LACTATED RINGERS IV BOLUS
20.0000 mL/kg | Freq: Once | INTRAVENOUS | Status: AC
  Administered 2020-08-18: 1606 mL via INTRAVENOUS

## 2020-08-18 MED ORDER — INSULIN ASPART 100 UNIT/ML ~~LOC~~ SOLN
0.0000 [IU] | SUBCUTANEOUS | Status: DC
  Administered 2020-08-19: 2 [IU] via SUBCUTANEOUS
  Administered 2020-08-19: 5 [IU] via SUBCUTANEOUS
  Administered 2020-08-19: 2 [IU] via SUBCUTANEOUS

## 2020-08-18 MED ORDER — DEXTROSE IN LACTATED RINGERS 5 % IV SOLN: INTRAVENOUS | Status: DC

## 2020-08-18 MED ORDER — INSULIN REGULAR(HUMAN) IN NACL 100-0.9 UT/100ML-% IV SOLN
INTRAVENOUS | Status: DC
Start: 2020-08-18 — End: 2020-08-18
  Administered 2020-08-18: 14 [IU]/h via INTRAVENOUS
  Filled 2020-08-18: qty 100

## 2020-08-18 MED ORDER — CHLORHEXIDINE GLUCONATE 0.12 % MT SOLN
15.0000 mL | Freq: Two times a day (BID) | OROMUCOSAL | Status: DC
  Administered 2020-08-18 – 2020-08-19 (×2): 15 mL via OROMUCOSAL
  Filled 2020-08-18: qty 15

## 2020-08-18 MED ORDER — ORAL CARE MOUTH RINSE
15.0000 mL | Freq: Two times a day (BID) | OROMUCOSAL | Status: DC
  Administered 2020-08-18: 15 mL via OROMUCOSAL

## 2020-08-18 MED ORDER — IOHEXOL 300 MG/ML  SOLN
100.0000 mL | Freq: Once | INTRAMUSCULAR | Status: AC | PRN
  Administered 2020-08-18: 100 mL via INTRAVENOUS

## 2020-08-18 MED ORDER — DEXTROSE 50 % IV SOLN: 0.0000 mL | INTRAVENOUS | Status: DC | PRN

## 2020-08-18 MED ORDER — ENOXAPARIN SODIUM 40 MG/0.4ML ~~LOC~~ SOLN
40.0000 mg | SUBCUTANEOUS | Status: DC
  Administered 2020-08-18: 40 mg via SUBCUTANEOUS
  Filled 2020-08-18 (×2): qty 0.4

## 2020-08-18 MED ORDER — INSULIN GLARGINE 100 UNIT/ML ~~LOC~~ SOLN
12.0000 [IU] | SUBCUTANEOUS | Status: DC
  Administered 2020-08-18 – 2020-08-19 (×2): 12 [IU] via SUBCUTANEOUS
  Filled 2020-08-18 (×2): qty 0.12

## 2020-08-18 NOTE — ED Notes (Signed)
Pt transferred to ct
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
## 2020-08-18 NOTE — ED Triage Notes (Signed)
Patient had to fast for a procedure at endoscopy yesterday, reports her sugars being 'off' since then (they gave her 10 units Humalog before they would do the procedure). Denies emesis, endorses intermittent nausea, denies diarrhea. Normally on 7030 and meal-time insulin. Went to PCP for hyperglycemia, and they sent her here.

## 2020-08-19 DIAGNOSIS — E1010 Type 1 diabetes mellitus with ketoacidosis without coma: Secondary | ICD-10-CM | POA: Diagnosis not present

## 2020-08-19 LAB — BASIC METABOLIC PANEL
Anion gap: 10 (ref 5–15)
BUN: 15 mg/dL (ref 8–23)
CO2: 23 mmol/L (ref 22–32)
Calcium: 8.8 mg/dL — ABNORMAL LOW (ref 8.9–10.3)
Chloride: 102 mmol/L (ref 98–111)
Creatinine, Ser: 0.76 mg/dL (ref 0.44–1.00)
GFR, Estimated: >60 mL/min (ref >60)
Glucose, Bld: 180 mg/dL — ABNORMAL HIGH (ref 70–99)
Potassium: 4.2 mmol/L (ref 3.5–5.1)
Sodium: 135 mmol/L (ref 135–145)

## 2020-08-19 LAB — GLUCOSE, CAPILLARY
Glucose-Capillary: 132 mg/dL — ABNORMAL HIGH (ref 70–99)
Glucose-Capillary: 136 mg/dL — ABNORMAL HIGH (ref 70–99)
Glucose-Capillary: 194 mg/dL — ABNORMAL HIGH (ref 70–99)
Glucose-Capillary: 201 mg/dL — ABNORMAL HIGH (ref 70–99)
Glucose-Capillary: 216 mg/dL — ABNORMAL HIGH (ref 70–99)
Glucose-Capillary: 261 mg/dL — ABNORMAL HIGH (ref 70–99)
Glucose-Capillary: 273 mg/dL — ABNORMAL HIGH (ref 70–99)

## 2020-08-19 LAB — BETA-HYDROXYBUTYRIC ACID
Beta-Hydroxybutyric Acid: 0.45 mmol/L — ABNORMAL HIGH (ref 0.05–0.27)
Beta-Hydroxybutyric Acid: 2.77 mmol/L — ABNORMAL HIGH (ref 0.05–0.27)

## 2020-08-19 MED ORDER — GABAPENTIN 300 MG PO CAPS
300.0000 mg | ORAL_CAPSULE | Freq: Every day | ORAL | Status: DC
  Administered 2020-08-19 – 2020-08-20 (×2): 300 mg via ORAL
  Filled 2020-08-19 (×2): qty 1

## 2020-08-19 MED ORDER — INSULIN ASPART PROT & ASPART (70-30 MIX) 100 UNIT/ML ~~LOC~~ SUSP: 16.0000 [IU] | Freq: Two times a day (BID) | SUBCUTANEOUS | Status: DC

## 2020-08-19 MED ORDER — IRBESARTAN 75 MG PO TABS
37.5000 mg | ORAL_TABLET | Freq: Every day | ORAL | Status: DC
  Administered 2020-08-19 – 2020-08-20 (×2): 37.5 mg via ORAL
  Filled 2020-08-19: qty 0.5
  Filled 2020-08-19: qty 1

## 2020-08-19 MED ORDER — ROSUVASTATIN CALCIUM 5 MG PO TABS
5.0000 mg | ORAL_TABLET | Freq: Every evening | ORAL | Status: DC
  Administered 2020-08-19: 5 mg via ORAL
  Filled 2020-08-19: qty 1

## 2020-08-19 MED ORDER — BISACODYL 10 MG RE SUPP
10.0000 mg | Freq: Every day | RECTAL | Status: DC | PRN
  Filled 2020-08-19: qty 1

## 2020-08-19 MED ORDER — INSULIN ASPART 100 UNIT/ML ~~LOC~~ SOLN
0.0000 [IU] | Freq: Three times a day (TID) | SUBCUTANEOUS | Status: DC
  Administered 2020-08-19: 5 [IU] via SUBCUTANEOUS
  Administered 2020-08-19 (×2): 8 [IU] via SUBCUTANEOUS
  Administered 2020-08-20: 15 [IU] via SUBCUTANEOUS
  Administered 2020-08-20: 2 [IU] via SUBCUTANEOUS

## 2020-08-19 MED ORDER — ASPIRIN EC 81 MG PO TBEC
81.0000 mg | DELAYED_RELEASE_TABLET | Freq: Every morning | ORAL | Status: DC
  Administered 2020-08-19 – 2020-08-20 (×2): 81 mg via ORAL
  Filled 2020-08-19 (×2): qty 1

## 2020-08-19 MED ORDER — GABAPENTIN 300 MG PO CAPS
600.0000 mg | ORAL_CAPSULE | Freq: Every day | ORAL | Status: DC
  Administered 2020-08-19: 600 mg via ORAL
  Filled 2020-08-19: qty 2

## 2020-08-19 NOTE — Progress Notes (Signed)
PROGRESS NOTE    Kelly Weaver  HFW:[id number] DOB: 03/11/1934 DOA: 08/18/2020 PCP: Caryl Bis, MD   Chief Complaint  Patient presents with  . Hyperglycemia  . Abdominal Pain  Brief Narrative: 87 year old female with history of diabetes mellitus on insulin, hypertension, hyperlipidemia presented with uncontrolled blood sugar at home.  Patient reports she was having difficulty with dysphagia last couple of weeks seen by GI doctor and underwent barium swallow and had EGD 3/17.  She also reports having constipation needing GoLYTELY which resolved. As per report- during the lead up to the EGD, her glucose level have "been all over the place". When she returned home after the EGD, her glucose stayed high and she was unable to control it. She woke this morning with sharp LUQ abdominal pain. She thought this may have something to do with the elevated glucose, and she decided that was enough to come to the ED.   In the ED CT abdomen pelvis was negative for any kind of acute finding blood sugar was 539 with ketones elevated anion gap.  Patient was placed on insulin infusion drip and admitted.  Subjective: Last night she was transitioned to Lantus off insulin drip. This morning she feels well no nausea vomiting abdominal pain chest pain.  Assessment & Plan:  DKA -likely in the setting of recent procedure n.p.o. status-stress.  Overnight transition to Lantus sliding scale.  DKA resolved.   IDDM w/ neuropathy on long-term insulin 70/30 at home: Blood sugar improving, change insulin to before every meal at bedtime, current to new current Lantus, can resume home insulin upon discharge.  Advancing diet today.  Continue home Neurontin. Recent Labs  Lab 08/18/20 2002 08/18/20 2207 08/19/20 0012 08/19/20 0322 08/19/20 0743  GLUCAP 140* 120* 132* 216* 136*   Dysphagia- had trouble swallowing metformin pills-had recent MBS/EGD. Her doctor was planning to change to liquid metformin.  Tolerating diet  currently.  Constipation- improved w/ golytely 2 wks ago. Prn dulcolax. Last BM at home 3/18  PVD HLD HTN Currently blood pressure is stable, no leg pain.  We will continue her ARBS, Crestor and aspirin.  Liver mass- She is aware and will follow up with her PCP appears to be stable last 3 years.  Left upper quadrant pain at home resolved since CT scan no acute finding  Overweight with BMI 29:Will benefit with weight loss, PCP follow-up  Diet Order            Diet heart healthy/carb modified Room service appropriate? Yes; Fluid consistency: Thin  Diet effective now                 Patient's Body mass index is 29.45 kg/m.  DVT prophylaxis: enoxaparin (LOVENOX) injection 40 mg Start: 08/18/20 2200 Code Status:   Code Status: Full Code  Family Communication: plan of care discussed with patient at bedside.  Status is: Inpatient Remains inpatient appropriate because:Inpatient level of care appropriate due to severity of illness   Dispo: The patient is from: Home              Anticipated d/c is to: Home tomorrow if sugar stable and tolerating diet.  Okay to transfer out of stepdown.              Patient currently is not medically stable to d/c.   Difficult to place patient No    Unresulted Labs (From admission, onward)          Start     Ordered  08/25/20 0500  Creatinine, serum  (enoxaparin (LOVENOX)    CrCl >/= 30 ml/min)  Weekly,   R     Comments: while on enoxaparin therapy    08/18/20 1536   08/19/20 9371  Basic metabolic panel  Daily,   R     Question:  Specimen collection method  Answer:  Lab=Lab collect   08/18/20 2026          Medications reviewed:  Scheduled Meds: . aspirin EC  81 mg Oral q morning  . chlorhexidine  15 mL Mouth Rinse BID  . Chlorhexidine Gluconate Cloth  6 each Topical Daily  . enoxaparin (LOVENOX) injection  40 mg Subcutaneous Q24H  . gabapentin  300 mg Oral Q0600  . gabapentin  600 mg Oral QHS  . insulin aspart  0-15 Units  Subcutaneous TID AC & HS  . insulin glargine  12 Units Subcutaneous Q24H  . irbesartan  37.5 mg Oral Daily  . mouth rinse  15 mL Mouth Rinse q12n4p  . rosuvastatin  5 mg Oral QPM   Continuous Infusions: . lactated ringers 125 mL/hr at 08/18/20 1057    Consultants:see note  Procedures:see note  Antimicrobials: Anti-infectives (From admission, onward)   None     Culture/Microbiology    Component Value Date/Time   SDES URINE, CLEAN CATCH 10/23/2012 1153   SPECREQUEST NONE 10/23/2012 1153   CULT  10/23/2012 1153    Multiple bacterial morphotypes present, none predominant. Suggest appropriate recollection if clinically indicated.   REPTSTATUS 10/24/2012 FINAL 10/23/2012 1153    Other culture-see note  Objective: Vitals: Today's Vitals   08/19/20 0700 08/19/20 0738 08/19/20 0800 08/19/20 0808  BP: (!) 121/56  (!) 130/54   Pulse: 70 66 67   Resp: 17 11 15    Temp:    99 F (37.2 C)  TempSrc:    Oral  SpO2: 94% 95% 99%   Weight:      Height:      PainSc:        Intake/Output Summary (Last 24 hours) at 08/19/2020 1101 Last data filed at 08/19/2020 0800 Gross per 24 hour  Intake 3421.77 ml  Output 1200 ml  Net 2221.77 ml   Filed Weights   08/18/20 0817  Weight: 80.3 kg   Weight change:   Intake/Output from previous day: 03/18 0701 - 03/19 0700 In: 3415.4 [I.V.:1517.2; IV Piggyback:1898.2] Out: -  Intake/Output this shift: Total I/O In: 6.4 [I.V.:6.4] Out: 1200 [Urine:1200] Filed Weights   08/18/20 0817  Weight: 80.3 kg    Examination: General exam: AAOX3 ,NAD, weak appearing. HEENT:Oral mucosa moist, Ear/Nose WNL grossly,dentition normal. Respiratory system: bilaterally diminished,no use of accessory muscle, non tender. Cardiovascular system: S1 & S2 +, regular, No JVD. Gastrointestinal system: Abdomen soft, NT,ND, BS+. Nervous System:Alert, awake, moving extremities and grossly nonfocal Extremities: No edema, distal peripheral pulses palpable.  Skin:  No rashes,no icterus. MSK: Normal muscle bulk,tone, power  Data Reviewed: I have personally reviewed following labs and imaging studies CBC: Recent Labs  Lab 08/18/20 0828  WBC 11.2*  HGB 13.4  HCT 40.7  MCV 93.6  PLT 696   Basic Metabolic Panel: Recent Labs  Lab 08/18/20 0828 08/18/20 1633 08/19/20 0243  NA 129* 131* 135  K 5.0 4.2 4.2  CL 91* 98 102  CO2 20* 23 23  GLUCOSE 539* 167* 180*  BUN 20 19 15   CREATININE 1.00 0.75 0.76  CALCIUM 9.3 9.0 8.8*   GFR: Estimated Creatinine Clearance: 54.8 mL/min (by C-G formula  based on SCr of 0.76 mg/dL). Liver Function Tests: No results for input(s): AST, ALT, ALKPHOS, BILITOT, PROT, ALBUMIN in the last 168 hours. No results for input(s): LIPASE, AMYLASE in the last 168 hours. No results for input(s): AMMONIA in the last 168 hours. Coagulation Profile: No results for input(s): INR, PROTIME in the last 168 hours. Cardiac Enzymes: No results for input(s): CKTOTAL, CKMB, CKMBINDEX, TROPONINI in the last 168 hours. BNP (last 3 results) No results for input(s): PROBNP in the last 8760 hours. HbA1C: Recent Labs    08/18/20 1623  HGBA1C 8.4*   CBG: Recent Labs  Lab 08/18/20 2002 08/18/20 2207 08/19/20 0012 08/19/20 0322 08/19/20 0743  GLUCAP 140* 120* 132* 216* 136*   Lipid Profile: No results for input(s): CHOL, HDL, LDLCALC, TRIG, CHOLHDL, LDLDIRECT in the last 72 hours. Thyroid Function Tests: No results for input(s): TSH, T4TOTAL, FREET4, T3FREE, THYROIDAB in the last 72 hours. Anemia Panel: No results for input(s): VITAMINB12, FOLATE, FERRITIN, TIBC, IRON, RETICCTPCT in the last 72 hours. Sepsis Labs: No results for input(s): PROCALCITON, LATICACIDVEN in the last 168 hours.  Recent Results (from the past 240 hour(s))  SARS CORONAVIRUS 2 (TAT 6-24 HRS) Nasopharyngeal Nasopharyngeal Swab     Status: None   Collection Time: 08/15/20  3:05 PM   Specimen: Nasopharyngeal Swab  Result Value Ref Range Status   SARS  Coronavirus 2 NEGATIVE NEGATIVE Final    Comment: (NOTE) SARS-CoV-2 target nucleic acids are NOT DETECTED.  The SARS-CoV-2 RNA is generally detectable in upper and lower respiratory specimens during the acute phase of infection. Negative results do not preclude SARS-CoV-2 infection, do not rule out co-infections with other pathogens, and should not be used as the sole basis for treatment or other patient management decisions. Negative results must be combined with clinical observations, patient history, and epidemiological information. The expected result is Negative.  Fact Sheet for Patients: SugarRoll.be  Fact Sheet for Healthcare Providers: https://www.woods-mathews.com/  This test is not yet approved or cleared by the [place] FDA and  has been authorized for detection and/or diagnosis of SARS-CoV-2 by FDA under an Emergency Use Authorization (EUA). This EUA will remain  in effect (meaning this test can be used) for the duration of the COVID-19 declaration under Se ction 564(b)(1) of the Act, 21 U.S.C. section 360bbb-3(b)(1), unless the authorization is terminated or revoked sooner.  Performed at [facility], [place] [address] [place], [place] [address]   MRSA PCR Screening     Status: None   Collection Time: 08/18/20  3:37 PM   Specimen: Nasal Mucosa; Nasopharyngeal  Result Value Ref Range Status   MRSA by PCR NEGATIVE NEGATIVE Final    Comment:        The GeneXpert MRSA Assay (FDA approved for NASAL specimens only), is one component of a comprehensive MRSA colonization surveillance program. It is not intended to diagnose MRSA infection nor to guide or monitor treatment for MRSA infections. Performed at [facility], [place] [address] [place], [place] [address]      Radiology Studies: CT ABDOMEN PELVIS W CONTRAST  Result Date: 08/18/2020 CLINICAL DATA:  Abdominal pain, recent endoscopy EXAM: CT  ABDOMEN AND PELVIS WITH CONTRAST TECHNIQUE: Multidetector CT imaging of the abdomen and pelvis was performed using the standard protocol following bolus administration of intravenous contrast. CONTRAST:  120mL OMNIPAQUE IOHEXOL 300 MG/ML  SOLN COMPARISON:  06/28/2017 FINDINGS: Lower chest: Scarring of the included bilateral lung bases. Coronary artery calcifications and/or stents. Hepatobiliary: Unchanged  brightly contrast enhancing subcapsular mass of the inferior left lobe of the liver measuring 2.5 cm (series 2, image 23). Stable, benign intermediate attenuation lesion of the inferior tip of the right lobe of the liver (series 2, image 30). No gallstones, gallbladder wall thickening, or biliary dilatation. Pancreas: Calcifications in the pancreatic head. No pancreatic ductal dilatation or surrounding inflammatory changes. Spleen: Normal in size without significant abnormality. Adrenals/Urinary Tract: Adrenal glands are unremarkable. Kidneys are normal, without renal calculi, solid lesion, or hydronephrosis. Bladder is unremarkable. Stomach/Bowel: Stomach is within normal limits. Appendix appears normal. No evidence of bowel wall thickening, distention, or inflammatory changes. Occasional sigmoid diverticula. Vascular/Lymphatic: Aortic atherosclerosis. No enlarged abdominal or pelvic lymph nodes. Reproductive: Calcified uterine fibroids. Other: No abdominal wall hernia or abnormality. No abdominopelvic ascites. Musculoskeletal: No acute or significant osseous findings. IMPRESSION: 1. No acute CT findings of the abdomen or pelvis to explain abdominal pain. Specifically, there is no evidence of complication of recent endoscopy such as bowel perforation. 2. Unchanged brightly contrast enhancing subcapsular mass of the inferior left lobe of the liver measuring 2.5 cm. This has been stable for greater than 3 years and is likely a benign focal nodular hyperplasia or flash filling hemangioma. 3. Calcifications in the  pancreatic head, consistent sequelae of prior pancreatitis. 4. Coronary artery disease. Aortic Atherosclerosis (ICD10-I70.0). Electronically Signed   By: Eddie Candle M.D.   On: 08/18/2020 12:36     LOS: 1 day   Antonieta Pert, MD [facility]  08/19/2020, 11:01 AM

## 2020-08-20 DIAGNOSIS — E1010 Type 1 diabetes mellitus with ketoacidosis without coma: Secondary | ICD-10-CM | POA: Diagnosis not present

## 2020-08-20 DIAGNOSIS — I10 Essential (primary) hypertension: Secondary | ICD-10-CM | POA: Diagnosis not present

## 2020-08-20 DIAGNOSIS — R1310 Dysphagia, unspecified: Secondary | ICD-10-CM | POA: Diagnosis not present

## 2020-08-20 DIAGNOSIS — E785 Hyperlipidemia, unspecified: Secondary | ICD-10-CM

## 2020-08-20 LAB — GLUCOSE, CAPILLARY
Glucose-Capillary: 116 mg/dL — ABNORMAL HIGH (ref 70–99)
Glucose-Capillary: 139 mg/dL — ABNORMAL HIGH (ref 70–99)
Glucose-Capillary: 376 mg/dL — ABNORMAL HIGH (ref 70–99)

## 2020-08-20 LAB — BASIC METABOLIC PANEL
Anion gap: 9 (ref 5–15)
BUN: 15 mg/dL (ref 8–23)
CO2: 26 mmol/L (ref 22–32)
Calcium: 8.6 mg/dL — ABNORMAL LOW (ref 8.9–10.3)
Chloride: 103 mmol/L (ref 98–111)
Creatinine, Ser: 0.59 mg/dL (ref 0.44–1.00)
GFR, Estimated: >60 mL/min (ref >60)
Glucose, Bld: 131 mg/dL — ABNORMAL HIGH (ref 70–99)
Potassium: 3.9 mmol/L (ref 3.5–5.1)
Sodium: 138 mmol/L (ref 135–145)

## 2020-08-20 MED ORDER — BISACODYL 10 MG RE SUPP: 10.0000 mg | Freq: Every day | RECTAL | 0 refills | Status: DC | PRN

## 2020-08-20 NOTE — Plan of Care (Signed)
  Problem: Education: Goal: Knowledge of General Education information will improve Description: Including pain rating scale, medication(s)/side effects and non-pharmacologic comfort measures Outcome: Progressing   Problem: Clinical Measurements: Goal: Ability to maintain clinical measurements within normal limits will improve Outcome: Progressing Goal: Will remain free from infection Outcome: Progressing Goal: Diagnostic test results will improve Outcome: Progressing Goal: Respiratory complications will improve Outcome: Progressing Goal: Cardiovascular complication will be avoided Outcome: Progressing   Problem: Activity: Goal: Risk for activity intolerance will decrease Outcome: Progressing   Problem: Elimination: Goal: Will not experience complications related to bowel motility Outcome: Progressing Goal: Will not experience complications related to urinary retention Outcome: Progressing   Problem: Pain Managment: Goal: General experience of comfort will improve Outcome: Progressing   Problem: Safety: Goal: Ability to remain free from injury will improve Outcome: Progressing   Problem: Skin Integrity: Goal: Risk for impaired skin integrity will decrease Outcome: Progressing   

## 2020-08-20 NOTE — Progress Notes (Signed)
Inpatient Diabetes Program Recommendations  AACE/ADA: New Consensus Statement on Inpatient Glycemic Control (2015)  Target Ranges:  Prepandial:   less than 140 mg/dL      Peak postprandial:   less than 180 mg/dL (1-2 hours)      Critically ill patients:  140 - 180 mg/dL   Lab Results  Component Value Date   GLUCAP 139 (H) 08/20/2020   HGBA1C 8.4 (H) 08/18/2020    Review of Glycemic Control Results for Kelly Weaver, Kelly Weaver (MRN [id number]) as of 08/20/2020 08:34  Ref. Range 08/19/2020 07:43 08/19/2020 11:58 08/19/2020 16:29 08/19/2020 19:46 08/19/2020 23:32 08/20/2020 04:52 08/20/2020 07:33  Glucose-Capillary Latest Ref Range: 70 - 99 mg/dL 136 (H) 201 (H) 261 (H) 273 (H) 194 (H) 116 (H) 139 (H)    Inpatient Diabetes Program Recommendations:    Please consider Novolog 4 units TID with meals as post prandials are elevated.  Will continue to follow while inpatient.  Thank you, Reche Dixon, RN, BSN Diabetes Coordinator Inpatient Diabetes Program [phone] (team pager from 8a-5p)

## 2020-08-20 NOTE — TOC Initial Note (Signed)
Transition of Care [facility]) - Initial/Assessment Note    Patient Details  Name: Kelly Weaver MRN: [id number] Date of Birth: 1934/02/26  Transition of Care [facility]) CM/SW Contact:    Norina Buzzard, RN Phone Number: 08/20/2020, 12:12 PM  Clinical Narrative: Pt with PMH of DM, HTN, HLD. Presented with elevated glucose reading. Received referral to assist with insurance question.   Met with pt and husband. Pt plans to return home with the support of her husband. She reports that she doesn't drive anymore but her husband does. She denies any issues with transportation of filling her prescriptions.  She wants to know if she was admitted as observation. Informed them that she is inpt.  She denies any other issues.  No D/C needs identified.               Expected Discharge Plan: Home/Self Care Barriers to Discharge: No Barriers Identified   Patient Goals and CMS Choice Patient states their goals for this hospitalization and ongoing recovery are:: to get better      Expected Discharge Plan and Services Expected Discharge Plan: Home/Self Care   Discharge Planning Services: CM Consult   Living arrangements for the past 2 months: Single Family Home Expected Discharge Date: 08/20/20                                    Prior Living Arrangements/Services Living arrangements for the past 2 months: Single Family Home Lives with:: Spouse Patient language and need for interpreter reviewed:: Yes Do you feel safe going back to the place where you live?: Yes               Activities of Daily Living Home Assistive Devices/Equipment: Eyeglasses,CBG Meter ADL Screening (condition at time of admission) Patient's cognitive ability adequate to safely complete daily activities?: Yes Is the patient deaf or have difficulty hearing?: No Does the patient have difficulty seeing, even when wearing glasses/contacts?: No Does the patient have difficulty concentrating,  remembering, or making decisions?: No Patient able to express need for assistance with ADLs?: Yes Does the patient have difficulty dressing or bathing?: No Independently performs ADLs?: Yes (appropriate for developmental age) Does the patient have difficulty walking or climbing stairs?: No Weakness of Legs: None Weakness of Arms/Hands: None  Permission Sought/Granted Permission sought to share information with : Case Manager                Emotional Assessment Appearance:: Well-Groomed,Appears younger than stated age Attitude/Demeanor/Rapport: Engaged,Gracious,Self-Confident Affect (typically observed): Accepting,Appropriate,Pleasant Orientation: : Oriented to Self,Oriented to Place,Oriented to  Time,Oriented to Situation      Admission diagnosis:  DKA (diabetic ketoacidosis) ([place]) [E11.10] Diabetic ketoacidosis without coma associated with type 2 diabetes mellitus ([place]) [E11.10] Patient Active Problem List   Diagnosis Date Noted  . DKA (diabetic ketoacidosis) ([place]) 08/18/2020  . Esophageal dysphagia 07/13/2020  . Constipation 07/13/2020  . S/P ORIF (open reduction internal fixation) fracture 07/30/2018  . Fuchs' corneal dystrophy 10/14/2017  . Lumbar radiculopathy 09/18/2017  . Nonproliferative diabetic retinopathy ([place]) 05/28/2013  . Epiretinal membrane 05/24/2013  . Nocturia 04/30/2013  . Anatomical narrow angle of right eye 04/22/2013  . Cystoid macular edema 04/22/2013  . Cataract extraction status of left eye 04/14/2013  . Nuclear sclerosis 03/26/2013  . Chronic cystitis 03/01/2013  . Flank pain 03/01/2013  . DM (diabetes mellitus), secondary, uncontrolled, periph vascul complic ([place]) 57/32/2025  . History of total knee  arthroplasty 11/02/2012  . HTN (hypertension) 11/02/2012  . HLD (hyperlipidemia) 11/02/2012  . Presence of unspecified artificial knee joint 09/22/2012  . Atherosclerosis of native arteries of the extremities with intermittent claudication  04/11/2011  . Cataract 04/03/2011   PCP:  Caryl Bis, MD Pharmacy:   [id number] [place]Phone: [phone]: [phone]    Social Determinants of Health (SDOH) Interventions    Readmission Risk Interventions No flowsheet data found.

## 2020-08-20 NOTE — Progress Notes (Signed)
Reviewed d/c instructions w pt and husband and all questions answered. Pt verbalized understanding. D/C per w/c w all belongings in stable condition.

## 2020-08-20 NOTE — Discharge Summary (Signed)
Discharge Summary  Kelly Weaver DJM:[id number] DOB: 09/07/1933  PCP: Caryl Bis, MD  Admit date: 08/18/2020 Discharge date: 08/20/2020  Time spent: 40 mins  Recommendations for Outpatient Follow-up:  1. Follow-up with PCP in 1 week  Discharge Diagnoses:  Active Hospital Problems   Diagnosis Date Noted  . DKA (diabetic ketoacidosis) ([place]) 08/18/2020  . HTN (hypertension) 11/02/2012  . HLD (hyperlipidemia) 11/02/2012    Resolved Hospital Problems  No resolved problems to display.    Discharge Condition: Stable  Diet recommendation: Moderate carb, heart healthy  Vitals:   08/20/20 0451 08/20/20 0935  BP: (!) 133/58 115/62  Pulse: 67 70  Resp: 17 16  Temp: 98 F (36.7 C) 99.1 F (37.3 C)  SpO2: 93% 93%    History of present illness:  87 year old female with history of diabetes mellitus on insulin, hypertension, hyperlipidemia presented with uncontrolled blood sugar at home.  Patient reports she was having difficulty with dysphagia last couple of weeks seen by GI doctor and underwent barium swallow and had EGD 3/17.  She also reports having constipation needing GoLYTELY which resolved. As per report- during the lead up to the EGD, her glucose level have "been all over the place".When she returned home after the EGD, her glucose stayedhigh and she was unable to control it.  Patient also reported some sharp LUQ abdominal pain, CT abdomen/pelvis was negative for any acute finding. In the ED, blood sugar was 539 with ketones elevated anion gap.  Patient was placed on insulin infusion drip and admitted for DKA.    Today, patient denies any new complaints, met patient at bedside eating breakfast without any significant issue/worsening dysphagia.  Patient denies any nausea/vomiting, abdominal pain, chest pain, shortness of breath, fever/chills.  Patient was able to ambulate the hallway with a walker without any issues.  Patient stable for discharge to follow-up with PCP in 1  week.     Hospital Course:  Active Problems:   HTN (hypertension)   HLD (hyperlipidemia)   DKA (diabetic ketoacidosis) (H[facility])   DKA/IDDM with neuropathy, uncontrolled DKA currently resolved, BS currently stable Last A1c on 08/18/2020-->8.4 Continue home insulin regimen, continue Metformin Continue home Neurontin  Dysphagia Recent MBS/EGD Currently tolerating oral diet  Follow-up with GI   Constipation Having BM DC home on bisacodyl suppository as needed  PVD HLD HTN Currently stable Continue home meds  Known chronic liver mass Stable last 3 years Follow-up with PCP    Estimated body mass index is 29.45 kg/m as calculated from the following:   Height as of this encounter: 5' 5"  (1.651 m).   Weight as of this encounter: 80.3 kg.    Procedures: None  Consultations:  None  Discharge Exam: BP 115/62 (BP Location: Right Arm)   Pulse 70   Temp 99.1 F (37.3 C) (Oral)   Resp 16   Ht 5' 5"  (1.651 m)   Wt 80.3 kg   SpO2 93%   BMI 29.45 kg/m   General: NAD Cardiovascular: S1, S2 present Respiratory: CTAB    Discharge Instructions You were cared for by a hospitalist during your hospital stay. If you have any questions about your discharge medications or the care you received while you were in the hospital after you are discharged, you can call the unit and asked to speak with the hospitalist on call if the hospitalist that took care of you is not available. Once you are discharged, your primary care physician will handle any further medical issues. Please note  that NO REFILLS for any discharge medications will be authorized once you are discharged, as it is imperative that you return to your primary care physician (or establish a relationship with a primary care physician if you do not have one) for your aftercare needs so that they can reassess your need for medications and monitor your lab values.  Discharge Instructions    Diet - low sodium heart  healthy   Complete by: As directed    Increase activity slowly   Complete by: As directed      Allergies as of 08/20/2020      Reactions   Ace Inhibitors Other (See Comments)   unknown   Ceftin [cefuroxime] Other (See Comments)   unknown   Doxycycline Swelling   Eyelids swell   Fluconazole    Other reaction(s): Unknown   Simvastatin Other (See Comments)   unknown      Medication List    TAKE these medications   aspirin EC 81 MG tablet Take 1 tablet (81 mg total) by mouth in the morning.   bisacodyl 10 MG suppository Commonly known as: DULCOLAX Place 1 suppository (10 mg total) rectally daily as needed for moderate constipation.   Calcium Carbonate-Vitamin D 600-200 MG-UNIT Tabs Take 2 tablets by mouth daily.   candesartan 4 MG tablet Commonly known as: ATACAND Take 4 mg by mouth in the morning.   FISH OIL PO Take 1 capsule by mouth every evening.   gabapentin 300 MG capsule Commonly known as: NEURONTIN Take 300-600 mg by mouth See admin instructions. Take 1 capsule (300 mg) by mouth in the morning & take 2 capsules (600 mg) by mouth at night.   glucose blood test strip 1 each by Other route as needed. Use as instructed   insulin aspart 100 UNIT/ML injection Commonly known as: novoLOG Inject 3-4 Units into the skin daily as needed for high blood sugar. This is as needed per sliding scales.   insulin NPH-regular Human (70-30) 100 UNIT/ML injection Inject 16-36 Units into the skin See admin instructions. Inject 36 units subcutaneously in the morning & inject 16 units subcutaneously in the evening   loratadine 10 MG tablet Commonly known as: CLARITIN Take 10 mg by mouth daily as needed for allergies.   metFORMIN 500 MG 24 hr tablet Commonly known as: GLUCOPHAGE-XR Take 1,000 mg by mouth in the morning and at bedtime.   OneTouch Delica Plus [id number] Misc USE 1 TO CHECK GLUCOSE 4 TIMES DAILY   PreserVision AREDS Caps Take 1 capsule by mouth in the  morning.   rosuvastatin 5 MG tablet Commonly known as: CRESTOR Take 5 mg by mouth every evening.      Allergies  Allergen Reactions  . Ace Inhibitors Other (See Comments)    unknown   . Ceftin [Cefuroxime] Other (See Comments)    unknown  . Doxycycline Swelling    Eyelids swell  . Fluconazole     Other reaction(s): Unknown  . Simvastatin Other (See Comments)    unknown    Follow-up Information    Caryl Bis, MD. Schedule an appointment as soon as possible for a visit in 1 week(s).   Specialty: Family Medicine Contact information: [address] [phone]                The results of significant diagnostics from this hospitalization (including imaging, microbiology, ancillary and laboratory) are listed below for reference.    Significant Diagnostic Studies: CT ABDOMEN PELVIS  W CONTRAST  Result Date: 08/18/2020 CLINICAL DATA:  Abdominal pain, recent endoscopy EXAM: CT ABDOMEN AND PELVIS WITH CONTRAST TECHNIQUE: Multidetector CT imaging of the abdomen and pelvis was performed using the standard protocol following bolus administration of intravenous contrast. CONTRAST:  166m OMNIPAQUE IOHEXOL 300 MG/ML  SOLN COMPARISON:  06/28/2017 FINDINGS: Lower chest: Scarring of the included bilateral lung bases. Coronary artery calcifications and/or stents. Hepatobiliary: Unchanged brightly contrast enhancing subcapsular mass of the inferior left lobe of the liver measuring 2.5 cm (series 2, image 23). Stable, benign intermediate attenuation lesion of the inferior tip of the right lobe of the liver (series 2, image 30). No gallstones, gallbladder wall thickening, or biliary dilatation. Pancreas: Calcifications in the pancreatic head. No pancreatic ductal dilatation or surrounding inflammatory changes. Spleen: Normal in size without significant abnormality. Adrenals/Urinary Tract: Adrenal glands are unremarkable. Kidneys are normal, without renal calculi, solid  lesion, or hydronephrosis. Bladder is unremarkable. Stomach/Bowel: Stomach is within normal limits. Appendix appears normal. No evidence of bowel wall thickening, distention, or inflammatory changes. Occasional sigmoid diverticula. Vascular/Lymphatic: Aortic atherosclerosis. No enlarged abdominal or pelvic lymph nodes. Reproductive: Calcified uterine fibroids. Other: No abdominal wall hernia or abnormality. No abdominopelvic ascites. Musculoskeletal: No acute or significant osseous findings. IMPRESSION: 1. No acute CT findings of the abdomen or pelvis to explain abdominal pain. Specifically, there is no evidence of complication of recent endoscopy such as bowel perforation. 2. Unchanged brightly contrast enhancing subcapsular mass of the inferior left lobe of the liver measuring 2.5 cm. This has been stable for greater than 3 years and is likely a benign focal nodular hyperplasia or flash filling hemangioma. 3. Calcifications in the pancreatic head, consistent sequelae of prior pancreatitis. 4. Coronary artery disease. Aortic Atherosclerosis (ICD10-I70.0). Electronically Signed   By: AEddie CandleM.D.   On: 08/18/2020 12:36   DG ESOPHAGUS W DOUBLE CM (HD)  Result Date: 08/07/2020 CLINICAL DATA:  Esophageal dysphagia.  Pills get stuck in throat. EXAM: ESOPHOGRAM / BARIUM SWALLOW / BARIUM TABLET STUDY TECHNIQUE: Combined double contrast and single contrast examination performed using effervescent crystals, thick barium liquid, and thin barium liquid. The patient was observed with fluoroscopy swallowing a 13 mm barium sulphate tablet. FLUOROSCOPY TIME:  Fluoroscopy Time:  1 minutes 30 second Radiation Exposure Index (if provided by the fluoroscopic device): Number of Acquired Spot Images: 7 COMPARISON:  None. FINDINGS: Decreased esophageal peristalsis particularly in the mid and distal esophagus with tertiary contractions. No esophageal stricture or mass lesion. No ulceration. No aspiration identified. Small hiatal  hernia.  No gastroesophageal reflux Barium tablet passed readily into the stomach. IMPRESSION: Decreased esophageal motility.  No stricture Small hiatal hernia without reflux. Electronically Signed   By: CFranchot GalloM.D.   On: 08/07/2020 09:52    Microbiology: Recent Results (from the past 240 hour(s))  SARS CORONAVIRUS 2 (TAT 6-24 HRS) Nasopharyngeal Nasopharyngeal Swab     Status: None   Collection Time: 08/15/20  3:05 PM   Specimen: Nasopharyngeal Swab  Result Value Ref Range Status   SARS Coronavirus 2 NEGATIVE NEGATIVE Final    Comment: (NOTE) SARS-CoV-2 target nucleic acids are NOT DETECTED.  The SARS-CoV-2 RNA is generally detectable in upper and lower respiratory specimens during the acute phase of infection. Negative results do not preclude SARS-CoV-2 infection, do not rule out co-infections with other pathogens, and should not be used as the sole basis for treatment or other patient management decisions. Negative results must be combined with clinical observations, patient history, and epidemiological information. The  expected result is Negative.  Fact Sheet for Patients: SugarRoll.be  Fact Sheet for Healthcare Providers: https://www.woods-mathews.com/  This test is not yet approved or cleared by the [place] FDA and  has been authorized for detection and/or diagnosis of SARS-CoV-2 by FDA under an Emergency Use Authorization (EUA). This EUA will remain  in effect (meaning this test can be used) for the duration of the COVID-19 declaration under Se ction 564(b)(1) of the Act, 21 U.S.C. section 360bbb-3(b)(1), unless the authorization is terminated or revoked sooner.  Performed at [facility], [place] [address], [place], [place] [address]   MRSA PCR Screening     Status: None   Collection Time: 08/18/20  3:37 PM   Specimen: Nasal Mucosa; Nasopharyngeal  Result Value Ref Range Status   MRSA by PCR NEGATIVE NEGATIVE  Final    Comment:        The GeneXpert MRSA Assay (FDA approved for NASAL specimens only), is one component of a comprehensive MRSA colonization surveillance program. It is not intended to diagnose MRSA infection nor to guide or monitor treatment for MRSA infections. Performed at [facility], [place] [address], [place], [place] [address]      Labs: Basic Metabolic Panel: Recent Labs  Lab 08/18/20 0828 08/18/20 1633 08/19/20 0243 08/20/20 0503  NA 129* 131* 135 138  K 5.0 4.2 4.2 3.9  CL 91* 98 102 103  CO2 20* 23 23 26   GLUCOSE 539* 167* 180* 131*  BUN 20 19 15 15   CREATININE 1.00 0.75 0.76 0.59  CALCIUM 9.3 9.0 8.8* 8.6*   Liver Function Tests: No results for input(s): AST, ALT, ALKPHOS, BILITOT, PROT, ALBUMIN in the last 168 hours. No results for input(s): LIPASE, AMYLASE in the last 168 hours. No results for input(s): AMMONIA in the last 168 hours. CBC: Recent Labs  Lab 08/18/20 0828  WBC 11.2*  HGB 13.4  HCT 40.7  MCV 93.6  PLT 290   Cardiac Enzymes: No results for input(s): CKTOTAL, CKMB, CKMBINDEX, TROPONINI in the last 168 hours. BNP: BNP (last 3 results) No results for input(s): BNP in the last 8760 hours.  ProBNP (last 3 results) No results for input(s): PROBNP in the last 8760 hours.  CBG: Recent Labs  Lab 08/19/20 1629 08/19/20 1946 08/19/20 2332 08/20/20 0452 08/20/20 0733  GLUCAP 261* 273* 194* 116* 139*       Signed:  Alma Friendly, MD [facility] 08/20/2020, 10:58 AM

## 2020-08-23 DIAGNOSIS — E1165 Type 2 diabetes mellitus with hyperglycemia: Secondary | ICD-10-CM | POA: Diagnosis not present

## 2020-08-24 ENCOUNTER — Encounter (HOSPITAL_COMMUNITY): Payer: Self-pay | Admitting: Internal Medicine

## 2020-08-30 ENCOUNTER — Telehealth (INDEPENDENT_AMBULATORY_CARE_PROVIDER_SITE_OTHER): Payer: Self-pay | Admitting: *Deleted

## 2020-08-30 NOTE — Telephone Encounter (Signed)
Patient and her husband presented to the office on 08/29/2020. Kelly Weaver states that she had been admitted to the hospital last weekend. She was discharged on Sunday.  She states she cannot have a bowel movement unless she uses a suppository, and now the suppository does not work as it was. She says that she is eating well. She is asking if she can take a laxative?  Per Dr. Laural Golden the patient should continue to take Linzess 72 mcg daily , Stop the Miralax and she should continue the stool and fiber supplement. He will call and talk with the patient about preceding with a Colonoscopy.  Patient was called and given the recommendations of Dr. Laural Golden , samples of Linzess was given with Dr. Laural Golden permission.

## 2020-09-19 DIAGNOSIS — E1110 Type 2 diabetes mellitus with ketoacidosis without coma: Secondary | ICD-10-CM | POA: Diagnosis not present

## 2020-09-26 ENCOUNTER — Telehealth (INDEPENDENT_AMBULATORY_CARE_PROVIDER_SITE_OTHER): Payer: Self-pay

## 2020-09-26 ENCOUNTER — Other Ambulatory Visit (INDEPENDENT_AMBULATORY_CARE_PROVIDER_SITE_OTHER): Payer: Self-pay | Admitting: Internal Medicine

## 2020-09-26 MED ORDER — LINACLOTIDE 72 MCG PO CAPS: 72.0000 ug | ORAL_CAPSULE | Freq: Every day | ORAL | 5 refills | Status: DC

## 2020-09-26 NOTE — Telephone Encounter (Signed)
Kelly Weaver, CMA  

## 2020-10-23 ENCOUNTER — Telehealth (INDEPENDENT_AMBULATORY_CARE_PROVIDER_SITE_OTHER): Payer: Self-pay | Admitting: *Deleted

## 2020-10-23 NOTE — Telephone Encounter (Signed)
Kelly Weaver called and states that she does not feel that the Linzess 72 mcg is working. She would like to try the the 145 mg prior to her next refill.  Her stools are hard balls, and of today she had not had a bowel movement. She is taking 3 stool softeners in the morning, and is taking the Linzess at night.  Kelly Weaver was told that this would be addressed with Dr. Laural Golden and I would call her in the morning.

## 2020-10-24 NOTE — Telephone Encounter (Signed)
Per Dr. Laural Golden the patient may take Linzess 145 mcg daily. Samples were given to the patient.

## 2020-11-04 ENCOUNTER — Emergency Department (HOSPITAL_COMMUNITY)
Admission: EM | Admit: 2020-11-04 | Discharge: 2020-11-04 | Disposition: A | Discharge status: Home / Self Care | Payer: Medicare Other | Attending: Emergency Medicine | Admitting: Emergency Medicine

## 2020-11-04 ENCOUNTER — Other Ambulatory Visit: Payer: Self-pay

## 2020-11-04 DIAGNOSIS — Z96652 Presence of left artificial knee joint: Secondary | ICD-10-CM | POA: Insufficient documentation

## 2020-11-04 DIAGNOSIS — Z7982 Long term (current) use of aspirin: Secondary | ICD-10-CM | POA: Diagnosis not present

## 2020-11-04 DIAGNOSIS — E1110 Type 2 diabetes mellitus with ketoacidosis without coma: Secondary | ICD-10-CM | POA: Diagnosis not present

## 2020-11-04 DIAGNOSIS — Z794 Long term (current) use of insulin: Secondary | ICD-10-CM | POA: Diagnosis not present

## 2020-11-04 DIAGNOSIS — R350 Frequency of micturition: Secondary | ICD-10-CM | POA: Diagnosis not present

## 2020-11-04 DIAGNOSIS — E1140 Type 2 diabetes mellitus with diabetic neuropathy, unspecified: Secondary | ICD-10-CM | POA: Diagnosis not present

## 2020-11-04 DIAGNOSIS — E1165 Type 2 diabetes mellitus with hyperglycemia: Secondary | ICD-10-CM | POA: Diagnosis not present

## 2020-11-04 DIAGNOSIS — R197 Diarrhea, unspecified: Secondary | ICD-10-CM | POA: Insufficient documentation

## 2020-11-04 DIAGNOSIS — Z7984 Long term (current) use of oral hypoglycemic drugs: Secondary | ICD-10-CM | POA: Diagnosis not present

## 2020-11-04 DIAGNOSIS — R739 Hyperglycemia, unspecified: Secondary | ICD-10-CM

## 2020-11-04 LAB — CBC WITH DIFFERENTIAL/PLATELET
Abs Immature Granulocytes: 0.02 10*3/uL (ref 0.00–0.07)
Basophils Absolute: 0.1 10*3/uL (ref 0.0–0.1)
Basophils Relative: 1 %
Eosinophils Absolute: 0 10*3/uL (ref 0.0–0.5)
Eosinophils Relative: 0 %
HCT: 44.1 % (ref 36.0–46.0)
Hemoglobin: 14.7 g/dL (ref 12.0–15.0)
Immature Granulocytes: 0 %
Lymphocytes Relative: 18 %
Lymphs Abs: 1 10*3/uL (ref 0.7–4.0)
MCH: 30.8 pg (ref 26.0–34.0)
MCHC: 33.3 g/dL (ref 30.0–36.0)
MCV: 92.3 fL (ref 80.0–100.0)
Monocytes Absolute: 0.3 10*3/uL (ref 0.1–1.0)
Monocytes Relative: 5 %
Neutro Abs: 4.3 10*3/uL (ref 1.7–7.7)
Neutrophils Relative %: 76 %
Platelets: 303 10*3/uL (ref 150–400)
RBC: 4.78 MIL/uL (ref 3.87–5.11)
RDW: 13.2 % (ref 11.5–15.5)
WBC: 5.7 10*3/uL (ref 4.0–10.5)
nRBC: 0 % (ref 0.0–0.2)

## 2020-11-04 LAB — COMPREHENSIVE METABOLIC PANEL
ALT: 17 U/L (ref 0–44)
AST: 22 U/L (ref 15–41)
Albumin: 4.4 g/dL (ref 3.5–5.0)
Alkaline Phosphatase: 77 U/L (ref 38–126)
Anion gap: 12 (ref 5–15)
BUN: 14 mg/dL (ref 8–23)
CO2: 26 mmol/L (ref 22–32)
Calcium: 9.5 mg/dL (ref 8.9–10.3)
Chloride: 96 mmol/L — ABNORMAL LOW (ref 98–111)
Creatinine, Ser: 0.72 mg/dL (ref 0.44–1.00)
GFR, Estimated: >60 mL/min (ref >60)
Glucose, Bld: 353 mg/dL — ABNORMAL HIGH (ref 70–99)
Potassium: 4.3 mmol/L (ref 3.5–5.1)
Sodium: 134 mmol/L — ABNORMAL LOW (ref 135–145)
Total Bilirubin: 1.1 mg/dL (ref 0.3–1.2)
Total Protein: 7.8 g/dL (ref 6.5–8.1)

## 2020-11-04 LAB — URINALYSIS, ROUTINE W REFLEX MICROSCOPIC
Bilirubin Urine: NEGATIVE
Glucose, UA: >=500 mg/dL — AB
Hgb urine dipstick: NEGATIVE
Ketones, ur: 20 mg/dL — AB
Leukocytes,Ua: NEGATIVE
Nitrite: NEGATIVE
Protein, ur: NEGATIVE mg/dL
Specific Gravity, Urine: 1.008 (ref 1.005–1.030)
pH: 6 (ref 5.0–8.0)

## 2020-11-04 LAB — CBG MONITORING, ED
Glucose-Capillary: 325 mg/dL — ABNORMAL HIGH (ref 70–99)
Glucose-Capillary: 321 mg/dL — ABNORMAL HIGH (ref 70–99)

## 2020-11-04 MED ORDER — SODIUM CHLORIDE 0.9 % IV BOLUS
500.0000 mL | Freq: Once | INTRAVENOUS | Status: AC
  Administered 2020-11-04: 500 mL via INTRAVENOUS

## 2020-11-04 MED ORDER — INSULIN ASPART 100 UNIT/ML IJ SOLN
10.0000 [IU] | Freq: Once | INTRAMUSCULAR | Status: AC
  Administered 2020-11-04: 10 [IU] via SUBCUTANEOUS
  Filled 2020-11-04: qty 0.1

## 2020-11-04 NOTE — Discharge Instructions (Addendum)
Take your normal evening medications.  Do not take Linzess today or tomorrow.  Call Dr. Laural Golden on Monday for further instructions
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
## 2020-11-04 NOTE — ED Triage Notes (Signed)
Patient reports she has had diarrhea since she went to bed. Patient says she took a linzess prior to bed.

## 2020-11-07 ENCOUNTER — Other Ambulatory Visit (INDEPENDENT_AMBULATORY_CARE_PROVIDER_SITE_OTHER): Payer: Self-pay | Admitting: Gastroenterology

## 2020-11-07 ENCOUNTER — Telehealth (INDEPENDENT_AMBULATORY_CARE_PROVIDER_SITE_OTHER): Payer: Self-pay

## 2020-11-07 DIAGNOSIS — K5900 Constipation, unspecified: Secondary | ICD-10-CM

## 2020-11-07 MED ORDER — LINACLOTIDE 72 MCG PO CAPS: 72.0000 ug | ORAL_CAPSULE | Freq: Every day | ORAL | 1 refills | Status: AC

## 2020-11-07 NOTE — Telephone Encounter (Signed)
Patient aware of all and I have # 32 tablet/samples here for her to pick up from the office.

## 2020-11-07 NOTE — Telephone Encounter (Signed)
Patient called and states she was recently changed from 72 mcg Linzess to the 145 mg, she developed diarrhea and ended up in the Ed on 11/04/2020, They advised her to stop taking the 145 mg Linzess and to call the office to see if she could change back to the 72 mcg as she has now developed constipation again. If ok the patient will need samples. Please advise.

## 2020-11-07 NOTE — Telephone Encounter (Signed)
Ok to get the 72 mcg samples, will send the prescription of 72 mcg qday Thanks

## 2020-11-08 DIAGNOSIS — Z1321 Encounter for screening for nutritional disorder: Secondary | ICD-10-CM | POA: Diagnosis not present

## 2020-11-08 DIAGNOSIS — E7849 Other hyperlipidemia: Secondary | ICD-10-CM | POA: Diagnosis not present

## 2020-11-08 DIAGNOSIS — E782 Mixed hyperlipidemia: Secondary | ICD-10-CM | POA: Diagnosis not present

## 2020-11-08 DIAGNOSIS — I10 Essential (primary) hypertension: Secondary | ICD-10-CM | POA: Diagnosis not present

## 2020-11-08 DIAGNOSIS — R946 Abnormal results of thyroid function studies: Secondary | ICD-10-CM | POA: Diagnosis not present

## 2020-11-08 DIAGNOSIS — E559 Vitamin D deficiency, unspecified: Secondary | ICD-10-CM | POA: Diagnosis not present

## 2020-11-08 DIAGNOSIS — R7309 Other abnormal glucose: Secondary | ICD-10-CM | POA: Diagnosis not present

## 2020-11-08 DIAGNOSIS — E1165 Type 2 diabetes mellitus with hyperglycemia: Secondary | ICD-10-CM | POA: Diagnosis not present

## 2020-11-13 DIAGNOSIS — E7849 Other hyperlipidemia: Secondary | ICD-10-CM | POA: Diagnosis not present

## 2020-11-13 DIAGNOSIS — I10 Essential (primary) hypertension: Secondary | ICD-10-CM | POA: Diagnosis not present

## 2020-11-13 DIAGNOSIS — E1142 Type 2 diabetes mellitus with diabetic polyneuropathy: Secondary | ICD-10-CM | POA: Diagnosis not present

## 2020-11-13 DIAGNOSIS — G6289 Other specified polyneuropathies: Secondary | ICD-10-CM | POA: Diagnosis not present

## 2020-11-13 DIAGNOSIS — Z7189 Other specified counseling: Secondary | ICD-10-CM | POA: Diagnosis not present

## 2020-11-13 DIAGNOSIS — Z6829 Body mass index (BMI) 29.0-29.9, adult: Secondary | ICD-10-CM | POA: Diagnosis not present

## 2020-11-22 DIAGNOSIS — E1165 Type 2 diabetes mellitus with hyperglycemia: Secondary | ICD-10-CM | POA: Diagnosis not present

## 2020-11-27 DIAGNOSIS — R6884 Jaw pain: Secondary | ICD-10-CM | POA: Diagnosis not present

## 2020-11-27 DIAGNOSIS — H1131 Conjunctival hemorrhage, right eye: Secondary | ICD-10-CM | POA: Diagnosis not present

## 2020-11-28 ENCOUNTER — Other Ambulatory Visit: Payer: Self-pay

## 2020-11-28 ENCOUNTER — Encounter (INDEPENDENT_AMBULATORY_CARE_PROVIDER_SITE_OTHER): Payer: Self-pay | Admitting: Internal Medicine

## 2020-11-28 ENCOUNTER — Ambulatory Visit (INDEPENDENT_AMBULATORY_CARE_PROVIDER_SITE_OTHER): Payer: Medicare Other | Admitting: Internal Medicine

## 2020-11-28 VITALS — BP 127/64 | HR 67 | Temp 98.8°F | Ht 65.0 in | Wt 173.0 lb

## 2020-11-28 DIAGNOSIS — K5900 Constipation, unspecified: Secondary | ICD-10-CM

## 2020-11-28 DIAGNOSIS — R1319 Other dysphagia: Secondary | ICD-10-CM | POA: Diagnosis not present

## 2020-11-28 NOTE — Patient Instructions (Signed)
Can take Linzess 72 mcg daily or every other day.

## 2020-11-28 NOTE — Progress Notes (Signed)
Presenting complaint;  Positive for constipation. History of dysphagia.  Database and subjective:  Patient is 87 year old Caucasian female who is here for scheduled visit accompanied by her husband Harmon Pier. Patient was initially seen on 07/13/2020 for dysphagia she also noted recent onset of constipation.  She was noted to be impacted.  Acute abdominal series did not show colonic obstruction.  She was given a gallon of GoLytely and she finally had relief.  She also had barium study which suggested motility problem. On her follow-up visit on 08/10/2020 she was doing better with dietary measures and a stool softener.  She was still complaining of dysphagia. She underwent esophagogastroduodenoscopy with esophageal dilation on 08/17/2020.  No structural abnormality was noted to esophageal mucosa.  She had gastritis.  Esophagus was dilated by passing 54 French Maloney dilator and no mucosal disruption noted.  She also had gastritis.  Esophageal biopsy did not show any abnormality and gastric biopsy was negative for H. pylori. Following EGD she developed elevated blood glucose level in DKA and was hospitalized at [facility] for few days. In the meantime she was begun on Linzess/linaclotide for constipation.   She is doing well.  She takes Linzess every other day and her bowels move every other day.  She usually has good evacuation.  She denies abdominal pain melena or rectal bleeding.  She does not understand why all of a sudden she has developed constipation.  She wonders if she ever would return to her baseline.  She also wants to try probiotic.  She states that TSH level recently was normal. She is not interested in undergoing colonoscopy.  She is afraid that it would put her back in DKA. [facility] concerned she says she is doing well but she chooses "[place] eat slowly and stay away from foods that might give her trouble.  She has not had any food impaction. She is also complaining of pain in the  region of right parotid gland.  She says she had difficulty opening her jaw when yesterday but she is doing better today.  She has not had any fever or chills. She states she was seen at [facility] yesterday for conjunctival hemorrhage.  Current Medications: Outpatient Encounter Medications as of 11/28/2020  Medication Sig   aspirin EC 81 MG tablet Take 1 tablet (81 mg total) by mouth in the morning.   bisacodyl (DULCOLAX) 10 MG suppository Place 1 suppository (10 mg total) rectally daily as needed for moderate constipation.   Calcium Carbonate-Vitamin D 600-200 MG-UNIT TABS Take 2 tablets by mouth daily.   candesartan (ATACAND) 4 MG tablet Take 4 mg by mouth in the morning.   gabapentin (NEURONTIN) 300 MG capsule Take 300-600 mg by mouth See admin instructions. Take 1 capsule (300 mg) by mouth in the morning & take 2 capsules (600 mg) by mouth at night.   glucose blood test strip 1 each by Other route as needed. Use as instructed   insulin aspart (NOVOLOG) 100 UNIT/ML injection Inject 3-4 Units into the skin daily as needed for high blood sugar. This is as needed per sliding scales.   insulin NPH-regular Human (70-30) 100 UNIT/ML injection Inject 16-36 Units into the skin See admin instructions. Inject 36 units subcutaneously in the morning & inject 18 units subcutaneously in the evening   Lancets (ONETOUCH DELICA PLUS [id number]) MISC USE 1 TO CHECK GLUCOSE 4 TIMES DAILY   linaclotide (LINZESS) 72 MCG capsule Take 1 capsule (72 mcg total) by mouth daily before breakfast. (Patient taking differently:  Take 72 mcg by mouth every other day.)   loratadine (CLARITIN) 10 MG tablet Take 10 mg by mouth daily as needed for allergies.   metFORMIN (GLUCOPHAGE-XR) 500 MG 24 hr tablet Take 500 mg by mouth daily with breakfast.   Multiple Vitamins-Minerals (PRESERVISION AREDS) CAPS Take 1 capsule by mouth in the morning.   Omega-3 Fatty Acids (FISH OIL PO) Take 1 capsule by mouth every evening.   rosuvastatin  (CRESTOR) 5 MG tablet Take 5 mg by mouth every evening.   No facility-administered encounter medications on file as of 11/28/2020.    Objective: Blood pressure 127/64, pulse 67, temperature 98.8 F (37.1 C), temperature source Oral, height 5' 5"  (1.651 m), weight 173 lb (78.5 kg). Patient is alert and in no acute distress. She is wearing a mask. Conjunctiva is pink. Sclera is nonicteric Oropharyngeal mucosa is normal. Right parotid gland is prominent.  It is mildly tender but it is soft. No neck masses or thyromegaly noted. Cardiac exam with regular rhythm normal S1 and S2. No murmur or gallop noted. Lungs are clear to auscultation. Abdomen is full but soft and nontender with no organomegaly or masses. No LE edema or clubbing noted.  Labs/studies Results:   CBC Latest Ref Rng & Units 11/04/2020 08/18/2020 11/05/2012  WBC 4.0 - 10.5 K/uL 5.7 11.2(H) 5.6  Hemoglobin 12.0 - 15.0 g/dL 14.7 13.4 11.0(L)  Hematocrit 36.0 - 46.0 % 44.1 40.7 32.2(L)  Platelets 150 - 400 K/uL 303 290 207    CMP Latest Ref Rng & Units 11/04/2020 08/20/2020 08/19/2020  Glucose 70 - 99 mg/dL 353(H) 131(H) 180(H)  BUN 8 - 23 mg/dL 14 15 15   Creatinine 0.44 - 1.00 mg/dL 0.72 0.59 0.76  Sodium 135 - 145 mmol/L 134(L) 138 135  Potassium 3.5 - 5.1 mmol/L 4.3 3.9 4.2  Chloride 98 - 111 mmol/L 96(L) 103 102  CO2 22 - 32 mmol/L 26 26 23   Calcium 8.9 - 10.3 mg/dL 9.5 8.6(L) 8.8(L)  Total Protein 6.5 - 8.1 g/dL 7.8 - -  Total Bilirubin 0.3 - 1.2 mg/dL 1.1 - -  Alkaline Phos 38 - 126 U/L 77 - -  AST 15 - 41 U/L 22 - -  ALT 0 - 44 U/L 17 - -    Hepatic Function Latest Ref Rng & Units 11/04/2020 10/23/2012 07/04/2010  Total Protein 6.5 - 8.1 g/dL 7.8 7.4 6.6  Albumin 3.5 - 5.0 g/dL 4.4 3.7 3.7  AST 15 - 41 U/L 22 16 18   ALT 0 - 44 U/L 17 14 15   Alk Phosphatase 38 - 126 U/L 77 86 58  Total Bilirubin 0.3 - 1.2 mg/dL 1.1 0.6 0.7     Assessment:  #1. .  Constipation.  She does not have alarm symptoms.  I suspect  constipation is due to medication and diabetes.  There is no way to know that she would return to her baseline and not require medication.  Another way to look at it as she is on low-dose Linzess which is working which should be a big relief as some patients with constipation.  Respond to the usual therapies. I do not see any contraindication to using probiotic.  #2.  Esophageal dysphagia.  Suspect motility disorder.  Esophageal dilation seem to have helped some.  She is managing this condition well by watching her diet.  #3.  Swollen right parotid.  I wonder if she passed the stone.  If pain gets worse she will need to be seen by ENT  specialist.  Dr. Benjamine Mola is not in this office anymore.  She can contact his office in [place] if she needs to see him.   Plan:  Continue Linzess/linaclotide 72 mcg p.o. daily or every other day. Continue with high-fiber diet and drink plenty of fluids. Can take align 1 capsule daily or acidophilus daily. Patient was given samples of Linzess. Patient will call if Linzess stops working. Office visit in 6 months.

## 2020-11-30 DIAGNOSIS — K1121 Acute sialoadenitis: Secondary | ICD-10-CM | POA: Diagnosis not present

## 2020-11-30 DIAGNOSIS — E119 Type 2 diabetes mellitus without complications: Secondary | ICD-10-CM | POA: Diagnosis not present

## 2020-11-30 DIAGNOSIS — Z6831 Body mass index (BMI) 31.0-31.9, adult: Secondary | ICD-10-CM | POA: Diagnosis not present

## 2020-12-09 ENCOUNTER — Other Ambulatory Visit: Payer: Self-pay

## 2020-12-09 ENCOUNTER — Emergency Department (HOSPITAL_COMMUNITY)
Admission: EM | Admit: 2020-12-09 | Discharge: 2020-12-09 | Disposition: A | Discharge status: Home / Self Care | Payer: Medicare Other | Attending: Emergency Medicine | Admitting: Emergency Medicine

## 2020-12-09 DIAGNOSIS — Z7984 Long term (current) use of oral hypoglycemic drugs: Secondary | ICD-10-CM | POA: Diagnosis not present

## 2020-12-09 DIAGNOSIS — E1151 Type 2 diabetes mellitus with diabetic peripheral angiopathy without gangrene: Secondary | ICD-10-CM | POA: Insufficient documentation

## 2020-12-09 DIAGNOSIS — Z79899 Other long term (current) drug therapy: Secondary | ICD-10-CM | POA: Insufficient documentation

## 2020-12-09 DIAGNOSIS — E1169 Type 2 diabetes mellitus with other specified complication: Secondary | ICD-10-CM | POA: Diagnosis not present

## 2020-12-09 DIAGNOSIS — E113219 Type 2 diabetes mellitus with mild nonproliferative diabetic retinopathy with macular edema, unspecified eye: Secondary | ICD-10-CM | POA: Diagnosis not present

## 2020-12-09 DIAGNOSIS — Z794 Long term (current) use of insulin: Secondary | ICD-10-CM | POA: Insufficient documentation

## 2020-12-09 DIAGNOSIS — R6883 Chills (without fever): Secondary | ICD-10-CM | POA: Insufficient documentation

## 2020-12-09 DIAGNOSIS — E1165 Type 2 diabetes mellitus with hyperglycemia: Secondary | ICD-10-CM | POA: Diagnosis not present

## 2020-12-09 DIAGNOSIS — Z7982 Long term (current) use of aspirin: Secondary | ICD-10-CM | POA: Insufficient documentation

## 2020-12-09 DIAGNOSIS — R112 Nausea with vomiting, unspecified: Secondary | ICD-10-CM | POA: Insufficient documentation

## 2020-12-09 DIAGNOSIS — R1013 Epigastric pain: Secondary | ICD-10-CM | POA: Insufficient documentation

## 2020-12-09 DIAGNOSIS — E1136 Type 2 diabetes mellitus with diabetic cataract: Secondary | ICD-10-CM | POA: Insufficient documentation

## 2020-12-09 DIAGNOSIS — E1140 Type 2 diabetes mellitus with diabetic neuropathy, unspecified: Secondary | ICD-10-CM | POA: Diagnosis not present

## 2020-12-09 DIAGNOSIS — E785 Hyperlipidemia, unspecified: Secondary | ICD-10-CM | POA: Insufficient documentation

## 2020-12-09 LAB — CBC WITH DIFFERENTIAL/PLATELET
Abs Immature Granulocytes: 0.02 10*3/uL (ref 0.00–0.07)
Basophils Absolute: 0 10*3/uL (ref 0.0–0.1)
Basophils Relative: 0 %
Eosinophils Absolute: 0 10*3/uL (ref 0.0–0.5)
Eosinophils Relative: 0 %
HCT: 37.5 % (ref 36.0–46.0)
Hemoglobin: 13.1 g/dL (ref 12.0–15.0)
Immature Granulocytes: 0 %
Lymphocytes Relative: 16 %
Lymphs Abs: 0.8 10*3/uL (ref 0.7–4.0)
MCH: 30.8 pg (ref 26.0–34.0)
MCHC: 34.9 g/dL (ref 30.0–36.0)
MCV: 88 fL (ref 80.0–100.0)
Monocytes Absolute: 0.2 10*3/uL (ref 0.1–1.0)
Monocytes Relative: 5 %
Neutro Abs: 3.9 10*3/uL (ref 1.7–7.7)
Neutrophils Relative %: 79 %
Platelets: 302 10*3/uL (ref 150–400)
RBC: 4.26 MIL/uL (ref 3.87–5.11)
RDW: 12.8 % (ref 11.5–15.5)
WBC: 4.9 10*3/uL (ref 4.0–10.5)
nRBC: 0 % (ref 0.0–0.2)

## 2020-12-09 LAB — URINALYSIS, ROUTINE W REFLEX MICROSCOPIC
Bacteria, UA: NONE SEEN
Bilirubin Urine: NEGATIVE
Glucose, UA: >=500 mg/dL — AB
Hgb urine dipstick: NEGATIVE
Ketones, ur: 20 mg/dL — AB
Leukocytes,Ua: NEGATIVE
Nitrite: NEGATIVE
Protein, ur: NEGATIVE mg/dL
Specific Gravity, Urine: 1.017 (ref 1.005–1.030)
pH: 6 (ref 5.0–8.0)

## 2020-12-09 LAB — CBG MONITORING, ED
Glucose-Capillary: 375 mg/dL — ABNORMAL HIGH (ref 70–99)
Glucose-Capillary: 326 mg/dL — ABNORMAL HIGH (ref 70–99)
Glucose-Capillary: 317 mg/dL — ABNORMAL HIGH (ref 70–99)
Glucose-Capillary: 295 mg/dL — ABNORMAL HIGH (ref 70–99)
Glucose-Capillary: 278 mg/dL — ABNORMAL HIGH (ref 70–99)
Glucose-Capillary: 276 mg/dL — ABNORMAL HIGH (ref 70–99)

## 2020-12-09 LAB — LIPASE, BLOOD: Lipase: 27 U/L (ref 11–51)

## 2020-12-09 LAB — COMPREHENSIVE METABOLIC PANEL
ALT: 17 U/L (ref 0–44)
AST: 20 U/L (ref 15–41)
Albumin: 4 g/dL (ref 3.5–5.0)
Alkaline Phosphatase: 66 U/L (ref 38–126)
Anion gap: 11 (ref 5–15)
BUN: 16 mg/dL (ref 8–23)
CO2: 21 mmol/L — ABNORMAL LOW (ref 22–32)
Calcium: 9.2 mg/dL (ref 8.9–10.3)
Chloride: 90 mmol/L — ABNORMAL LOW (ref 98–111)
Creatinine, Ser: 0.89 mg/dL (ref 0.44–1.00)
GFR, Estimated: >60 mL/min (ref >60)
Glucose, Bld: 368 mg/dL — ABNORMAL HIGH (ref 70–99)
Potassium: 5.2 mmol/L — ABNORMAL HIGH (ref 3.5–5.1)
Sodium: 122 mmol/L — ABNORMAL LOW (ref 135–145)
Total Bilirubin: 0.9 mg/dL (ref 0.3–1.2)
Total Protein: 6.8 g/dL (ref 6.5–8.1)

## 2020-12-09 LAB — TROPONIN I (HIGH SENSITIVITY): Troponin I (High Sensitivity): 3 ng/L (ref <18)

## 2020-12-09 MED ORDER — ONDANSETRON HCL 4 MG/2ML IJ SOLN
4.0000 mg | Freq: Once | INTRAMUSCULAR | Status: AC
  Administered 2020-12-09: 4 mg via INTRAVENOUS
  Filled 2020-12-09: qty 2

## 2020-12-09 MED ORDER — INSULIN ASPART PROT & ASPART (70-30 MIX) 100 UNIT/ML ~~LOC~~ SUSP
36.0000 [IU] | Freq: Once | SUBCUTANEOUS | Status: AC
  Administered 2020-12-09: 36 [IU] via SUBCUTANEOUS
  Filled 2020-12-09 (×2): qty 10

## 2020-12-09 MED ORDER — SODIUM CHLORIDE 0.9 % IV BOLUS
500.0000 mL | Freq: Once | INTRAVENOUS | Status: AC
  Administered 2020-12-09: 500 mL via INTRAVENOUS

## 2020-12-09 MED ORDER — PANTOPRAZOLE SODIUM 20 MG PO TBEC: 20.0000 mg | DELAYED_RELEASE_TABLET | Freq: Every day | ORAL | 0 refills | Status: DC

## 2020-12-09 MED ORDER — ALUM & MAG HYDROXIDE-SIMETH 200-200-20 MG/5ML PO SUSP
30.0000 mL | Freq: Once | ORAL | Status: AC
  Administered 2020-12-09: 30 mL via ORAL
  Filled 2020-12-09: qty 30

## 2020-12-09 MED ORDER — SODIUM CHLORIDE 0.9 % IV BOLUS
1000.0000 mL | Freq: Once | INTRAVENOUS | Status: AC
  Administered 2020-12-09: 1000 mL via INTRAVENOUS

## 2020-12-09 MED ORDER — PANTOPRAZOLE SODIUM 40 MG IV SOLR
40.0000 mg | Freq: Once | INTRAVENOUS | Status: AC
  Administered 2020-12-09: 40 mg via INTRAVENOUS
  Filled 2020-12-09: qty 40

## 2020-12-09 MED ORDER — ONDANSETRON 4 MG PO TBDP: 4.0000 mg | ORAL_TABLET | Freq: Three times a day (TID) | ORAL | 0 refills | Status: DC | PRN

## 2020-12-09 MED ORDER — ALUM & MAG HYDROXIDE-SIMETH 400-400-40 MG/5ML PO SUSP: 15.0000 mL | Freq: Four times a day (QID) | ORAL | 0 refills | Status: DC | PRN

## 2020-12-09 NOTE — ED Triage Notes (Signed)
Pt came in with c/o emesis and epigastric pain. Pt has been taking Bactrim for a swollen parotid gland. Pt has hx DKA. Pt BGL on her meter is currently reading HI on her Dexcom
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
## 2020-12-09 NOTE — Discharge Instructions (Addendum)
You came to the emergency department today to have your high blood sugar, abdominal pain, and nausea and vomiting.  Your lab work showed no signs of diabetic ketoacidosis.  Your lab work was reassuring.  You had improvement in your symptoms after receiving antinausea medication, Protonix, and Mylanta.  I have given you prescription for these medications.  Please use them as prescribed.  Please follow-up with your primary care provider and your endocrinologist.  Get help right away if: Your pain does not go away as soon as your health care provider told you to expect. You cannot stop vomiting. Your pain is only in areas of the abdomen, such as the right side or the left lower portion of the abdomen. Pain on the right side could be caused by appendicitis. You have bloody or black stools, or stools that look like tar. You have severe pain, cramping, or bloating in your abdomen. You have signs of dehydration, such as: Dark urine, very little urine, or no urine. Cracked lips. Dry mouth. Sunken eyes. Sleepiness. Weakness. You have trouble breathing or chest pain.

## 2020-12-09 NOTE — ED Notes (Addendum)
Pt's 02 sats dropped to 87% on ra.  02 placed at 2L via [place], sats now 94%.  RR e/u, pt in no acute distress.

## 2020-12-11 ENCOUNTER — Emergency Department (HOSPITAL_COMMUNITY): Payer: Medicare Other

## 2020-12-11 ENCOUNTER — Other Ambulatory Visit: Payer: Self-pay

## 2020-12-11 ENCOUNTER — Emergency Department (HOSPITAL_COMMUNITY)
Admission: EM | Admit: 2020-12-11 | Discharge: 2020-12-11 | Disposition: A | Discharge status: Home / Self Care | Payer: Medicare Other | Attending: Emergency Medicine | Admitting: Emergency Medicine

## 2020-12-11 ENCOUNTER — Encounter (HOSPITAL_COMMUNITY): Payer: Self-pay | Admitting: Emergency Medicine

## 2020-12-11 DIAGNOSIS — Z79899 Other long term (current) drug therapy: Secondary | ICD-10-CM | POA: Insufficient documentation

## 2020-12-11 DIAGNOSIS — R112 Nausea with vomiting, unspecified: Secondary | ICD-10-CM | POA: Insufficient documentation

## 2020-12-11 DIAGNOSIS — E113219 Type 2 diabetes mellitus with mild nonproliferative diabetic retinopathy with macular edema, unspecified eye: Secondary | ICD-10-CM | POA: Diagnosis not present

## 2020-12-11 DIAGNOSIS — Z7984 Long term (current) use of oral hypoglycemic drugs: Secondary | ICD-10-CM | POA: Diagnosis not present

## 2020-12-11 DIAGNOSIS — M47816 Spondylosis without myelopathy or radiculopathy, lumbar region: Secondary | ICD-10-CM | POA: Diagnosis not present

## 2020-12-11 DIAGNOSIS — R1013 Epigastric pain: Secondary | ICD-10-CM | POA: Diagnosis not present

## 2020-12-11 DIAGNOSIS — E1110 Type 2 diabetes mellitus with ketoacidosis without coma: Secondary | ICD-10-CM | POA: Diagnosis not present

## 2020-12-11 DIAGNOSIS — I10 Essential (primary) hypertension: Secondary | ICD-10-CM | POA: Diagnosis not present

## 2020-12-11 DIAGNOSIS — R197 Diarrhea, unspecified: Secondary | ICD-10-CM | POA: Insufficient documentation

## 2020-12-11 DIAGNOSIS — K8689 Other specified diseases of pancreas: Secondary | ICD-10-CM | POA: Diagnosis not present

## 2020-12-11 DIAGNOSIS — Z96653 Presence of artificial knee joint, bilateral: Secondary | ICD-10-CM | POA: Insufficient documentation

## 2020-12-11 DIAGNOSIS — D259 Leiomyoma of uterus, unspecified: Secondary | ICD-10-CM | POA: Diagnosis not present

## 2020-12-11 DIAGNOSIS — K7689 Other specified diseases of liver: Secondary | ICD-10-CM | POA: Diagnosis not present

## 2020-12-11 DIAGNOSIS — Z794 Long term (current) use of insulin: Secondary | ICD-10-CM | POA: Diagnosis not present

## 2020-12-11 DIAGNOSIS — Z7982 Long term (current) use of aspirin: Secondary | ICD-10-CM | POA: Diagnosis not present

## 2020-12-11 LAB — COMPREHENSIVE METABOLIC PANEL
ALT: 17 U/L (ref 0–44)
AST: 19 U/L (ref 15–41)
Albumin: 3.7 g/dL (ref 3.5–5.0)
Alkaline Phosphatase: 63 U/L (ref 38–126)
Anion gap: 8 (ref 5–15)
BUN: 15 mg/dL (ref 8–23)
CO2: 25 mmol/L (ref 22–32)
Calcium: 9.3 mg/dL (ref 8.9–10.3)
Chloride: 96 mmol/L — ABNORMAL LOW (ref 98–111)
Creatinine, Ser: 0.71 mg/dL (ref 0.44–1.00)
GFR, Estimated: >60 mL/min (ref >60)
Glucose, Bld: 241 mg/dL — ABNORMAL HIGH (ref 70–99)
Potassium: 4 mmol/L (ref 3.5–5.1)
Sodium: 129 mmol/L — ABNORMAL LOW (ref 135–145)
Total Bilirubin: 0.8 mg/dL (ref 0.3–1.2)
Total Protein: 7 g/dL (ref 6.5–8.1)

## 2020-12-11 LAB — CBC WITH DIFFERENTIAL/PLATELET
Abs Immature Granulocytes: 0.01 10*3/uL (ref 0.00–0.07)
Basophils Absolute: 0 10*3/uL (ref 0.0–0.1)
Basophils Relative: 0 %
Eosinophils Absolute: 0 10*3/uL (ref 0.0–0.5)
Eosinophils Relative: 0 %
HCT: 40 % (ref 36.0–46.0)
Hemoglobin: 14.1 g/dL (ref 12.0–15.0)
Immature Granulocytes: 0 %
Lymphocytes Relative: 23 %
Lymphs Abs: 1.1 10*3/uL (ref 0.7–4.0)
MCH: 30.9 pg (ref 26.0–34.0)
MCHC: 35.3 g/dL (ref 30.0–36.0)
MCV: 87.5 fL (ref 80.0–100.0)
Monocytes Absolute: 0.3 10*3/uL (ref 0.1–1.0)
Monocytes Relative: 7 %
Neutro Abs: 3.2 10*3/uL (ref 1.7–7.7)
Neutrophils Relative %: 70 %
Platelets: 324 10*3/uL (ref 150–400)
RBC: 4.57 MIL/uL (ref 3.87–5.11)
RDW: 12.8 % (ref 11.5–15.5)
WBC: 4.6 10*3/uL (ref 4.0–10.5)
nRBC: 0 % (ref 0.0–0.2)

## 2020-12-11 LAB — LIPASE, BLOOD: Lipase: 27 U/L (ref 11–51)

## 2020-12-11 LAB — URINALYSIS, ROUTINE W REFLEX MICROSCOPIC
Bacteria, UA: NONE SEEN
Bilirubin Urine: NEGATIVE
Glucose, UA: 150 mg/dL — AB
Ketones, ur: NEGATIVE mg/dL
Leukocytes,Ua: NEGATIVE
Nitrite: NEGATIVE
Protein, ur: NEGATIVE mg/dL
Specific Gravity, Urine: 1.004 — ABNORMAL LOW (ref 1.005–1.030)
pH: 8 (ref 5.0–8.0)

## 2020-12-11 LAB — MAGNESIUM: Magnesium: 2 mg/dL (ref 1.7–2.4)

## 2020-12-11 LAB — TROPONIN I (HIGH SENSITIVITY): Troponin I (High Sensitivity): 3 ng/L (ref <18)

## 2020-12-11 LAB — CBG MONITORING, ED: Glucose-Capillary: 147 mg/dL — ABNORMAL HIGH (ref 70–99)

## 2020-12-11 LAB — LACTIC ACID, PLASMA: Lactic Acid, Venous: 1.5 mmol/L (ref 0.5–1.9)

## 2020-12-11 MED ORDER — DIPHENHYDRAMINE HCL 50 MG/ML IJ SOLN
12.5000 mg | Freq: Once | INTRAMUSCULAR | Status: AC
  Administered 2020-12-11: 12.5 mg via INTRAVENOUS
  Filled 2020-12-11: qty 1

## 2020-12-11 MED ORDER — MORPHINE SULFATE (PF) 2 MG/ML IV SOLN
2.0000 mg | Freq: Once | INTRAVENOUS | Status: AC
  Administered 2020-12-11: 2 mg via INTRAVENOUS
  Filled 2020-12-11: qty 1

## 2020-12-11 MED ORDER — ALUM & MAG HYDROXIDE-SIMETH 200-200-20 MG/5ML PO SUSP
30.0000 mL | Freq: Once | ORAL | Status: AC
  Administered 2020-12-11: 30 mL via ORAL
  Filled 2020-12-11: qty 30

## 2020-12-11 MED ORDER — FAMOTIDINE 20 MG PO TABS: 20.0000 mg | ORAL_TABLET | Freq: Two times a day (BID) | ORAL | 0 refills | Status: DC

## 2020-12-11 MED ORDER — IOHEXOL 350 MG/ML SOLN
80.0000 mL | Freq: Once | INTRAVENOUS | Status: AC | PRN
  Administered 2020-12-11: 80 mL via INTRAVENOUS

## 2020-12-11 MED ORDER — PROMETHAZINE HCL 25 MG RE SUPP: 25.0000 mg | Freq: Four times a day (QID) | RECTAL | 0 refills | Status: DC | PRN

## 2020-12-11 MED ORDER — SODIUM CHLORIDE 0.9 % IV BOLUS
1000.0000 mL | Freq: Once | INTRAVENOUS | Status: AC
  Administered 2020-12-11: 1000 mL via INTRAVENOUS

## 2020-12-11 MED ORDER — METOCLOPRAMIDE HCL 5 MG/ML IJ SOLN
5.0000 mg | Freq: Once | INTRAMUSCULAR | Status: AC
  Administered 2020-12-11: 5 mg via INTRAVENOUS
  Filled 2020-12-11: qty 2

## 2020-12-11 MED ORDER — SODIUM CHLORIDE (PF) 0.9 % IJ SOLN
INTRAMUSCULAR | Status: AC
  Filled 2020-12-11: qty 50

## 2020-12-11 NOTE — ED Notes (Signed)
Patient said she was able to tolerate her chicken noodle soup well.
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
## 2020-12-11 NOTE — ED Notes (Signed)
Patient was given ice water.

## 2020-12-11 NOTE — Discharge Instructions (Addendum)
Try the brat diet bananas rice applesauce and toast.  This is gentle on your stomach.  Try to stop the Protonix and try Pepcid up to twice a day.  Use the ondansetron for nausea.  You can try the suppository if the oral medication is not helping.  Please try and see your GI doctor in the office.

## 2020-12-11 NOTE — ED Triage Notes (Signed)
Pt arrived via POV with c/o generalized abdominal pain. Pt reports this is an ongoing problem and nothing is working.  Also reports not being able to eat

## 2020-12-12 ENCOUNTER — Ambulatory Visit (INDEPENDENT_AMBULATORY_CARE_PROVIDER_SITE_OTHER): Payer: Medicare Other | Admitting: Internal Medicine

## 2020-12-12 ENCOUNTER — Encounter (INDEPENDENT_AMBULATORY_CARE_PROVIDER_SITE_OTHER): Payer: Self-pay | Admitting: Internal Medicine

## 2020-12-12 DIAGNOSIS — R112 Nausea with vomiting, unspecified: Secondary | ICD-10-CM | POA: Diagnosis not present

## 2020-12-12 DIAGNOSIS — R1013 Epigastric pain: Secondary | ICD-10-CM | POA: Diagnosis not present

## 2020-12-12 NOTE — Progress Notes (Signed)
Presenting complaint;  Nausea vomiting and epigastric pain.  Database and subjective:  Patient is 87 year old Caucasian female who has history of constipation and was most recently seen on 11/28/2020 and was doing well dietary measures and Linzess.  She wanted to try stool softener and probiotic.  She states she went almost a week without having to take Linzess and thought she was doing well.  On her last visit I felt that she had right parotitis and recommended follow-up with ENT specialist.  She was not able to make an appointment until end of this month.  She was begun on Bactrim by her primary physician on 11/30/2020.  She took antibiotic for 7 days and noted improvement in parotitis. On 12/08/2020 when she developed epigastric pain and nausea and vomiting.  Her symptoms persisted and she was seen in emergency room on 12/09/2020.  Evaluation in emergency room revealed normal CBC and normal lipase level of 27.  Comprehensive chemistry panel revealed serum sodium of 122, potassium of 5.2 but this was felt to be hemolyzed specimen.  Her CO2 was 21 glucose was 368.  BUN and creatinine were normal and so her LFTs and serum calcium.  Urine analysis pertinent for positive ketones.  Patient was given IV fluids and an IV Protonix.  Her glucose level dropped.  Patient's symptoms were felt to be due to Bactrim and she was given prescription for ondansetron on discharge. Patient returned to emergency room yesterday morning complaining of abdominal pain and nausea.  She had gone 4 days without a bowel movement.  She was reevaluated.  Once again her serum lipase CBC and LFTs are normal.  Abdominal pelvic CT did not reveal any evidence of pancreatitis stomach or bowel abnormalities.  Patient was advised to take famotidine 20 mg twice daily and continue Zofran and Mylanta on as-needed basis.  She was advised to follow-up with [place].  Patient says she has not had a bowel movement for 4 days.  Her epigastric pain has improved in  the last 24 hours she describes as nagging pain.  She did not experience hematemesis melena or rectal bleeding.  She had nausea yesterday but no vomiting.  She has lost 5 pounds since her last visit of 2 weeks ago.  She feels tired and weak.  She is now on bland diet.  Current Medications: Outpatient Encounter Medications as of 12/12/2020  Medication Sig   alum & mag hydroxide-simeth (MYLANTA MAXIMUM STRENGTH) 400-400-40 MG/5ML suspension Take 15 mLs by mouth every 6 (six) hours as needed for indigestion.   aspirin EC 81 MG tablet Take 1 tablet (81 mg total) by mouth in the morning.   bisacodyl (DULCOLAX) 10 MG suppository Place 1 suppository (10 mg total) rectally daily as needed for moderate constipation.   Calcium Carbonate-Vitamin D 600-200 MG-UNIT TABS Take 2 tablets by mouth daily.   candesartan (ATACAND) 4 MG tablet Take 4 mg by mouth in the morning.   docusate sodium (COLACE) 100 MG capsule Take 300 mg by mouth at bedtime.   famotidine (PEPCID) 20 MG tablet Take 1 tablet (20 mg total) by mouth 2 (two) times daily.   gabapentin (NEURONTIN) 300 MG capsule Take 300-600 mg by mouth See admin instructions. Take 1 capsule (300 mg) by mouth in the morning & take 2 capsules (600 mg) by mouth at night.   glucose blood test strip 1 each by Other route as needed. Use as instructed   insulin aspart (NOVOLOG) 100 UNIT/ML injection Inject 3-4 Units into the skin  daily as needed for high blood sugar. This is as needed per sliding scales.   insulin NPH-regular Human (70-30) 100 UNIT/ML injection Inject 16-36 Units into the skin See admin instructions. Inject 36 units subcutaneously in the morning & inject 16 units subcutaneously in the evening   Lancets (ONETOUCH DELICA PLUS [id number]) MISC USE 1 TO CHECK GLUCOSE 4 TIMES DAILY   loratadine (CLARITIN) 10 MG tablet Take 10 mg by mouth daily as needed for allergies.   metFORMIN (GLUCOPHAGE-XR) 500 MG 24 hr tablet Take 500 mg by mouth daily with breakfast.    Multiple Vitamins-Minerals (PRESERVISION AREDS) CAPS Take 1 capsule by mouth in the morning.   Omega-3 Fatty Acids (FISH OIL) 1000 MG CAPS Take 1,000 mg by mouth every evening.   ondansetron (ZOFRAN ODT) 4 MG disintegrating tablet Take 1 tablet (4 mg total) by mouth every 8 (eight) hours as needed for nausea or vomiting.   pantoprazole (PROTONIX) 20 MG tablet Take 1 tablet (20 mg total) by mouth daily.   rosuvastatin (CRESTOR) 5 MG tablet Take 5 mg by mouth every evening.   linaclotide (LINZESS) 72 MCG capsule Take 1 capsule (72 mcg total) by mouth daily before breakfast. (Patient not taking: Reported on 12/12/2020)   promethazine (PHENERGAN) 25 MG suppository Place 1 suppository (25 mg total) rectally every 6 (six) hours as needed for nausea or vomiting. (Patient not taking: Reported on 12/12/2020)   sulfamethoxazole-trimethoprim (BACTRIM DS) 800-160 MG tablet Take 2 tablets by mouth 2 (two) times daily. 10 day supply (Patient not taking: Reported on 12/12/2020)   No facility-administered encounter medications on file as of 12/12/2020.    Objective: Blood pressure (!) 141/66, pulse 60, temperature 98.4 F (36.9 C), temperature source Oral, height 5' 5"  (1.651 m), weight 168 lb 11.2 oz (76.5 kg). Patient is alert and in no acute distress. She is wearing a mask. Conjunctiva is pink. Sclera is nonicteric Oropharyngeal mucosa is normal. No neck masses or thyromegaly noted. Cardiac exam with regular rhythm normal S1 and S2. No murmur or gallop noted. Lungs are clear to auscultation. Abdomen is symmetrical.  Bowel sounds are normal.  On palpation abdomen is soft and nontender without organomegaly or masses. No LE edema or clubbing noted.  Labs/studies Results:   CBC Latest Ref Rng & Units 12/11/2020 12/09/2020 11/04/2020  WBC 4.0 - 10.5 K/uL 4.6 4.9 5.7  Hemoglobin 12.0 - 15.0 g/dL 14.1 13.1 14.7  Hematocrit 36.0 - 46.0 % 40.0 37.5 44.1  Platelets 150 - 400 K/uL 324 302 303    CMP Latest Ref Rng  & Units 12/11/2020 12/09/2020 11/04/2020  Glucose 70 - 99 mg/dL 241(H) 368(H) 353(H)  BUN 8 - 23 mg/dL 15 16 14   Creatinine 0.44 - 1.00 mg/dL 0.71 0.89 0.72  Sodium 135 - 145 mmol/L 129(L) 122(L) 134(L)  Potassium 3.5 - 5.1 mmol/L 4.0 5.2(H) 4.3  Chloride 98 - 111 mmol/L 96(L) 90(L) 96(L)  CO2 22 - 32 mmol/L 25 21(L) 26  Calcium 8.9 - 10.3 mg/dL 9.3 9.2 9.5  Total Protein 6.5 - 8.1 g/dL 7.0 6.8 7.8  Total Bilirubin 0.3 - 1.2 mg/dL 0.8 0.9 1.1  Alkaline Phos 38 - 126 U/L 63 66 77  AST 15 - 41 U/L 19 20 22   ALT 0 - 44 U/L 17 17 17     Hepatic Function Latest Ref Rng & Units 12/11/2020 12/09/2020 11/04/2020  Total Protein 6.5 - 8.1 g/dL 7.0 6.8 7.8  Albumin 3.5 - 5.0 g/dL 3.7 4.0 4.4  AST 15 -  41 U/L 19 20 22   ALT 0 - 44 U/L 17 17 17   Alk Phosphatase 38 - 126 U/L 63 66 77  Total Bilirubin 0.3 - 1.2 mg/dL 0.8 0.9 1.1    Abdominal pelvic CT images from 12/11/2020 reviewed. No evidence of cholelithiasis dilated bile duct gastric wall thickening small or large bowel abnormality.  Pancreas is unremarkable.  Assessment:  #1.  Recent illness consisting of nausea vomiting and epigastric pain most likely secondary to course of Bactrim that she took for acute right-sided parotitis.  Work-up during 2 ER visits in the last 3 days revealed normal LFTs serum calcium and lipase levels.  CT did not show any abnormality to account for her symptoms.  She appears to have improved over the last 24 hours.  While symptoms appear to be due to Bactrim will do an ultrasound to make sure she does not have cholesterol stones which would not be visible on CT. She can continue famotidine on schedule and ondansetron on as-needed basis.  #2.  Chronic constipation.  Acute illness has disrupted her bowel pattern.  She can use Linzess on as-needed basis along with stool softener every day.  Plan:  Patient advised not to use promethazine suppository and use only ondansetron if nausea recurs. Continue famotidine at a dose of 20 mg  p.o. twice daily for rest of this month. Abdominal ultrasound. Office visit in December as planned.

## 2020-12-12 NOTE — Patient Instructions (Signed)
Physician will call with results of abdominal ultrasound when completed.

## 2020-12-18 ENCOUNTER — Other Ambulatory Visit: Payer: Self-pay

## 2020-12-18 ENCOUNTER — Ambulatory Visit (HOSPITAL_COMMUNITY)
Admission: RE | Admit: 2020-12-18 | Discharge: 2020-12-18 | Disposition: A | Discharge status: Home / Self Care | Payer: Medicare Other | Source: Ambulatory Visit | Attending: Internal Medicine | Admitting: Internal Medicine

## 2020-12-18 DIAGNOSIS — R112 Nausea with vomiting, unspecified: Secondary | ICD-10-CM | POA: Insufficient documentation

## 2020-12-18 DIAGNOSIS — R1013 Epigastric pain: Secondary | ICD-10-CM | POA: Insufficient documentation

## 2020-12-29 DIAGNOSIS — J310 Chronic rhinitis: Secondary | ICD-10-CM | POA: Diagnosis not present

## 2020-12-29 DIAGNOSIS — J342 Deviated nasal septum: Secondary | ICD-10-CM | POA: Diagnosis not present

## 2020-12-29 DIAGNOSIS — J343 Hypertrophy of nasal turbinates: Secondary | ICD-10-CM | POA: Diagnosis not present

## 2020-12-29 DIAGNOSIS — K1121 Acute sialoadenitis: Secondary | ICD-10-CM | POA: Diagnosis not present

## 2020-12-29 DIAGNOSIS — H6123 Impacted cerumen, bilateral: Secondary | ICD-10-CM | POA: Diagnosis not present

## 2021-01-10 DIAGNOSIS — H0288B Meibomian gland dysfunction left eye, upper and lower eyelids: Secondary | ICD-10-CM | POA: Diagnosis not present

## 2021-01-10 DIAGNOSIS — Z794 Long term (current) use of insulin: Secondary | ICD-10-CM | POA: Diagnosis not present

## 2021-01-10 DIAGNOSIS — H0288A Meibomian gland dysfunction right eye, upper and lower eyelids: Secondary | ICD-10-CM | POA: Diagnosis not present

## 2021-01-10 DIAGNOSIS — E119 Type 2 diabetes mellitus without complications: Secondary | ICD-10-CM | POA: Diagnosis not present

## 2021-01-10 DIAGNOSIS — Z7984 Long term (current) use of oral hypoglycemic drugs: Secondary | ICD-10-CM | POA: Diagnosis not present

## 2021-01-10 DIAGNOSIS — H524 Presbyopia: Secondary | ICD-10-CM | POA: Diagnosis not present

## 2021-01-10 DIAGNOSIS — H04123 Dry eye syndrome of bilateral lacrimal glands: Secondary | ICD-10-CM | POA: Diagnosis not present

## 2021-01-10 DIAGNOSIS — Z79899 Other long term (current) drug therapy: Secondary | ICD-10-CM | POA: Diagnosis not present

## 2021-01-10 DIAGNOSIS — H35363 Drusen (degenerative) of macula, bilateral: Secondary | ICD-10-CM | POA: Diagnosis not present

## 2021-01-10 DIAGNOSIS — H5213 Myopia, bilateral: Secondary | ICD-10-CM | POA: Diagnosis not present

## 2021-01-10 DIAGNOSIS — H52203 Unspecified astigmatism, bilateral: Secondary | ICD-10-CM | POA: Diagnosis not present

## 2021-01-10 DIAGNOSIS — Z961 Presence of intraocular lens: Secondary | ICD-10-CM | POA: Diagnosis not present

## 2021-01-10 DIAGNOSIS — H02401 Unspecified ptosis of right eyelid: Secondary | ICD-10-CM | POA: Diagnosis not present

## 2021-01-16 DIAGNOSIS — E1140 Type 2 diabetes mellitus with diabetic neuropathy, unspecified: Secondary | ICD-10-CM | POA: Diagnosis not present

## 2021-01-16 DIAGNOSIS — E02 Subclinical iodine-deficiency hypothyroidism: Secondary | ICD-10-CM | POA: Diagnosis not present

## 2021-01-16 DIAGNOSIS — E1165 Type 2 diabetes mellitus with hyperglycemia: Secondary | ICD-10-CM | POA: Diagnosis not present

## 2021-01-16 DIAGNOSIS — E7800 Pure hypercholesterolemia, unspecified: Secondary | ICD-10-CM | POA: Diagnosis not present

## 2021-01-16 DIAGNOSIS — I10 Essential (primary) hypertension: Secondary | ICD-10-CM | POA: Diagnosis not present

## 2021-01-16 DIAGNOSIS — R202 Paresthesia of skin: Secondary | ICD-10-CM | POA: Diagnosis not present

## 2021-01-16 DIAGNOSIS — Z794 Long term (current) use of insulin: Secondary | ICD-10-CM | POA: Diagnosis not present

## 2021-03-01 DIAGNOSIS — L738 Other specified follicular disorders: Secondary | ICD-10-CM | POA: Diagnosis not present

## 2021-03-01 DIAGNOSIS — L218 Other seborrheic dermatitis: Secondary | ICD-10-CM | POA: Diagnosis not present

## 2021-03-19 DIAGNOSIS — L218 Other seborrheic dermatitis: Secondary | ICD-10-CM | POA: Diagnosis not present

## 2021-03-20 DIAGNOSIS — Z23 Encounter for immunization: Secondary | ICD-10-CM | POA: Diagnosis not present

## 2021-04-10 DIAGNOSIS — J343 Hypertrophy of nasal turbinates: Secondary | ICD-10-CM | POA: Diagnosis not present

## 2021-04-10 DIAGNOSIS — J342 Deviated nasal septum: Secondary | ICD-10-CM | POA: Diagnosis not present

## 2021-04-10 DIAGNOSIS — J310 Chronic rhinitis: Secondary | ICD-10-CM | POA: Diagnosis not present

## 2021-05-08 ENCOUNTER — Emergency Department (HOSPITAL_COMMUNITY)
Admission: EM | Admit: 2021-05-08 | Discharge: 2021-05-08 | Disposition: A | Discharge status: Home / Self Care | Payer: Medicare Other | Attending: Emergency Medicine | Admitting: Emergency Medicine

## 2021-05-08 ENCOUNTER — Encounter (HOSPITAL_COMMUNITY): Payer: Self-pay | Admitting: *Deleted

## 2021-05-08 DIAGNOSIS — E1165 Type 2 diabetes mellitus with hyperglycemia: Secondary | ICD-10-CM | POA: Insufficient documentation

## 2021-05-08 DIAGNOSIS — R739 Hyperglycemia, unspecified: Secondary | ICD-10-CM | POA: Diagnosis present

## 2021-05-08 DIAGNOSIS — Z96652 Presence of left artificial knee joint: Secondary | ICD-10-CM | POA: Diagnosis not present

## 2021-05-08 DIAGNOSIS — Z7982 Long term (current) use of aspirin: Secondary | ICD-10-CM | POA: Diagnosis not present

## 2021-05-08 DIAGNOSIS — E1140 Type 2 diabetes mellitus with diabetic neuropathy, unspecified: Secondary | ICD-10-CM | POA: Insufficient documentation

## 2021-05-08 DIAGNOSIS — E871 Hypo-osmolality and hyponatremia: Secondary | ICD-10-CM | POA: Insufficient documentation

## 2021-05-08 DIAGNOSIS — U071 COVID-19: Secondary | ICD-10-CM | POA: Insufficient documentation

## 2021-05-08 DIAGNOSIS — Z794 Long term (current) use of insulin: Secondary | ICD-10-CM | POA: Insufficient documentation

## 2021-05-08 DIAGNOSIS — I10 Essential (primary) hypertension: Secondary | ICD-10-CM | POA: Insufficient documentation

## 2021-05-08 DIAGNOSIS — Z7984 Long term (current) use of oral hypoglycemic drugs: Secondary | ICD-10-CM | POA: Diagnosis not present

## 2021-05-08 LAB — URINALYSIS, ROUTINE W REFLEX MICROSCOPIC
Bilirubin Urine: NEGATIVE
Glucose, UA: 100 mg/dL — AB
Ketones, ur: 15 mg/dL — AB
Leukocytes,Ua: NEGATIVE
Nitrite: NEGATIVE
Protein, ur: NEGATIVE mg/dL
Specific Gravity, Urine: <1.005 — ABNORMAL LOW (ref 1.005–1.030)
pH: 6 (ref 5.0–8.0)

## 2021-05-08 LAB — CBG MONITORING, ED: Glucose-Capillary: 224 mg/dL — ABNORMAL HIGH (ref 70–99)

## 2021-05-08 LAB — BASIC METABOLIC PANEL
Anion gap: 9 (ref 5–15)
BUN: 21 mg/dL (ref 8–23)
CO2: 25 mmol/L (ref 22–32)
Calcium: 8.6 mg/dL — ABNORMAL LOW (ref 8.9–10.3)
Chloride: 94 mmol/L — ABNORMAL LOW (ref 98–111)
Creatinine, Ser: 0.85 mg/dL (ref 0.44–1.00)
GFR, Estimated: >60 mL/min (ref >60)
Glucose, Bld: 234 mg/dL — ABNORMAL HIGH (ref 70–99)
Potassium: 4.1 mmol/L (ref 3.5–5.1)
Sodium: 128 mmol/L — ABNORMAL LOW (ref 135–145)

## 2021-05-08 LAB — CBC
HCT: 40.5 % (ref 36.0–46.0)
Hemoglobin: 13.8 g/dL (ref 12.0–15.0)
MCH: 31 pg (ref 26.0–34.0)
MCHC: 34.1 g/dL (ref 30.0–36.0)
MCV: 91 fL (ref 80.0–100.0)
Platelets: 246 10*3/uL (ref 150–400)
RBC: 4.45 MIL/uL (ref 3.87–5.11)
RDW: 12.9 % (ref 11.5–15.5)
WBC: 6.1 10*3/uL (ref 4.0–10.5)
nRBC: 0 % (ref 0.0–0.2)

## 2021-05-08 LAB — URINALYSIS, MICROSCOPIC (REFLEX)

## 2021-05-08 LAB — RESP PANEL BY RT-PCR (FLU A&B, COVID) ARPGX2
Influenza A by PCR: NEGATIVE
Influenza B by PCR: NEGATIVE
SARS Coronavirus 2 by RT PCR: POSITIVE — AB

## 2021-05-08 MED ORDER — NIRMATRELVIR/RITONAVIR (PAXLOVID)TABLET: 3.0000 | ORAL_TABLET | Freq: Two times a day (BID) | ORAL | 0 refills | Status: AC

## 2021-05-08 MED ORDER — SODIUM CHLORIDE 0.9 % IV BOLUS
1000.0000 mL | Freq: Once | INTRAVENOUS | Status: AC
  Administered 2021-05-08: 1000 mL via INTRAVENOUS

## 2021-05-08 NOTE — Discharge Instructions (Signed)
You have tested positive for COVID-19 today which may be the source of your body aches.  You have been given IV fluids as discussed help with hydration and also to help with your mild low sodium level.  Rest make sure you are drinking plenty of fluids at home.  Get rechecked immediately for any increasing weakness or if you develop any shortness of breath.  You have been prescribed an antiviral medication called Paxlovid which may help you get over your symptoms easier and quicker without developing advanced complications from this infection.
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
## 2021-05-15 DIAGNOSIS — Z20828 Contact with and (suspected) exposure to other viral communicable diseases: Secondary | ICD-10-CM | POA: Diagnosis not present

## 2021-05-18 DIAGNOSIS — Z1329 Encounter for screening for other suspected endocrine disorder: Secondary | ICD-10-CM | POA: Diagnosis not present

## 2021-05-18 DIAGNOSIS — E7849 Other hyperlipidemia: Secondary | ICD-10-CM | POA: Diagnosis not present

## 2021-05-18 DIAGNOSIS — E559 Vitamin D deficiency, unspecified: Secondary | ICD-10-CM | POA: Diagnosis not present

## 2021-05-18 DIAGNOSIS — Z20828 Contact with and (suspected) exposure to other viral communicable diseases: Secondary | ICD-10-CM | POA: Diagnosis not present

## 2021-05-18 DIAGNOSIS — E1142 Type 2 diabetes mellitus with diabetic polyneuropathy: Secondary | ICD-10-CM | POA: Diagnosis not present

## 2021-05-18 DIAGNOSIS — E782 Mixed hyperlipidemia: Secondary | ICD-10-CM | POA: Diagnosis not present

## 2021-05-18 DIAGNOSIS — I10 Essential (primary) hypertension: Secondary | ICD-10-CM | POA: Diagnosis not present

## 2021-05-22 DIAGNOSIS — I10 Essential (primary) hypertension: Secondary | ICD-10-CM | POA: Diagnosis not present

## 2021-05-22 DIAGNOSIS — G6289 Other specified polyneuropathies: Secondary | ICD-10-CM | POA: Diagnosis not present

## 2021-05-22 DIAGNOSIS — E1142 Type 2 diabetes mellitus with diabetic polyneuropathy: Secondary | ICD-10-CM | POA: Diagnosis not present

## 2021-05-22 DIAGNOSIS — E7849 Other hyperlipidemia: Secondary | ICD-10-CM | POA: Diagnosis not present

## 2021-05-22 DIAGNOSIS — Z0001 Encounter for general adult medical examination with abnormal findings: Secondary | ICD-10-CM | POA: Diagnosis not present

## 2021-05-22 DIAGNOSIS — Z6830 Body mass index (BMI) 30.0-30.9, adult: Secondary | ICD-10-CM | POA: Diagnosis not present

## 2021-05-24 DIAGNOSIS — R202 Paresthesia of skin: Secondary | ICD-10-CM | POA: Diagnosis not present

## 2021-05-24 DIAGNOSIS — I10 Essential (primary) hypertension: Secondary | ICD-10-CM | POA: Diagnosis not present

## 2021-05-24 DIAGNOSIS — E1165 Type 2 diabetes mellitus with hyperglycemia: Secondary | ICD-10-CM | POA: Diagnosis not present

## 2021-05-24 DIAGNOSIS — Z794 Long term (current) use of insulin: Secondary | ICD-10-CM | POA: Diagnosis not present

## 2021-05-24 DIAGNOSIS — E02 Subclinical iodine-deficiency hypothyroidism: Secondary | ICD-10-CM | POA: Diagnosis not present

## 2021-05-24 DIAGNOSIS — E7800 Pure hypercholesterolemia, unspecified: Secondary | ICD-10-CM | POA: Diagnosis not present

## 2021-05-24 DIAGNOSIS — E1140 Type 2 diabetes mellitus with diabetic neuropathy, unspecified: Secondary | ICD-10-CM | POA: Diagnosis not present

## 2021-05-29 ENCOUNTER — Other Ambulatory Visit: Payer: Self-pay

## 2021-05-29 ENCOUNTER — Ambulatory Visit (INDEPENDENT_AMBULATORY_CARE_PROVIDER_SITE_OTHER): Payer: Medicare Other | Admitting: Internal Medicine

## 2021-05-29 ENCOUNTER — Encounter (INDEPENDENT_AMBULATORY_CARE_PROVIDER_SITE_OTHER): Payer: Self-pay | Admitting: Internal Medicine

## 2021-05-29 VITALS — BP 127/74 | HR 78 | Temp 98.8°F | Ht 65.0 in | Wt 167.4 lb

## 2021-05-29 DIAGNOSIS — Z860100 Personal history of colon polyps, unspecified: Secondary | ICD-10-CM | POA: Insufficient documentation

## 2021-05-29 DIAGNOSIS — K5904 Chronic idiopathic constipation: Secondary | ICD-10-CM | POA: Diagnosis not present

## 2021-05-29 NOTE — Patient Instructions (Signed)
Hemoccult x3.

## 2021-05-29 NOTE — Progress Notes (Signed)
Presenting complaint;  Follow for chronic constipation.  Database and subjective:  Patient is 87 year old Caucasian female who is here for scheduled visit accompanied by her husband Harmon Pier.  She was last seen in July 2022.  Prior to that she presented in February this year with esophageal dysphagia and responded to dilation.  She states she is doing well from GI standpoint.  She is not having any swallowing difficulty.  She also denies heartburn regurgitation nausea or vomiting.  She is taking Linzess every other day and her bowels move every other day as well.  She generally has good evacuation.  Her appetite is good and her weight has been stable.  She tells me that her diabetic control is still suboptimal.  Her endocrinologist wants her to try to keep her glucose below 200 but she says on some days it is above 200. She has a history of colonic adenoma and family history of colon carcinoma.  Last colonoscopy was 11 years ago.  She is reluctant to have colonoscopy because she is afraid that she will end up in the hospital with hyperosmolar state or DKA.  She ended up in the hospital for glucose levels of over 500 following an EGD. She wants to do Hemoccults.  She says she has not had stool testing by her primary providers this year.  She is not having melena or rectal bleeding. She also complains of rhinitis.  Her husband states she has hypertrophic turbinates.  She is seeing Dr. Benjamine Mola and seem to be improving with Flonase.  She is considering surgery if medical therapy fails.  Current Medications: Outpatient Encounter Medications as of 05/29/2021  Medication Sig   aspirin EC 81 MG tablet Take 1 tablet (81 mg total) by mouth in the morning.   bisacodyl (DULCOLAX) 10 MG suppository Place 1 suppository (10 mg total) rectally daily as needed for moderate constipation.   Calcium Carbonate-Vitamin D 600-200 MG-UNIT TABS Take 2 tablets by mouth daily.   candesartan (ATACAND) 4 MG tablet Take 4 mg by  mouth in the morning.   docusate sodium (COLACE) 100 MG capsule Take 300 mg by mouth at bedtime.   gabapentin (NEURONTIN) 300 MG capsule Take 300-600 mg by mouth See admin instructions. Take 1 capsule (300 mg) by mouth in the morning & take 2 capsules (600 mg) by mouth at night.   glucose blood test strip 1 each by Other route as needed. Use as instructed   insulin aspart (NOVOLOG) 100 UNIT/ML injection Inject 3-4 Units into the skin daily as needed for high blood sugar. This is as needed per sliding scales.   insulin NPH-regular Human (70-30) 100 UNIT/ML injection Inject 16-36 Units into the skin See admin instructions. Inject 38 units subcutaneously in the morning & inject 17 units subcutaneously in the evening   Lancets (ONETOUCH DELICA PLUS [id number]) MISC USE 1 TO CHECK GLUCOSE 4 TIMES DAILY   linaclotide (LINZESS) 72 MCG capsule Take 1 capsule (72 mcg total) by mouth daily before breakfast.   loratadine (CLARITIN) 10 MG tablet Take 10 mg by mouth daily as needed for allergies.   metFORMIN (GLUCOPHAGE-XR) 500 MG 24 hr tablet Take 500 mg by mouth daily with breakfast.   Multiple Vitamins-Minerals (PRESERVISION AREDS) CAPS Take 1 capsule by mouth in the morning.   Omega-3 Fatty Acids (FISH OIL) 1000 MG CAPS Take 1,000 mg by mouth every evening.   rosuvastatin (CRESTOR) 5 MG tablet Take 5 mg by mouth every evening.   ondansetron (ZOFRAN ODT) 4 MG  disintegrating tablet Take 1 tablet (4 mg total) by mouth every 8 (eight) hours as needed for nausea or vomiting. (Patient not taking: Reported on 05/29/2021)   [DISCONTINUED] alum & mag hydroxide-simeth (MYLANTA MAXIMUM STRENGTH) 400-400-40 MG/5ML suspension Take 15 mLs by mouth every 6 (six) hours as needed for indigestion.   [DISCONTINUED] famotidine (PEPCID) 20 MG tablet Take 1 tablet (20 mg total) by mouth 2 (two) times daily.   No facility-administered encounter medications on file as of 05/29/2021.     Objective: Blood pressure 127/74, pulse  78, temperature 98.8 F (37.1 C), temperature source Oral, height _0  (1.651 m), weight 167 lb 6.4 oz (75.9 kg). Patient is alert and in no acute distress. Conjunctiva is pink. Sclera is nonicteric Oropharyngeal mucosa is normal. She has low partial plate. No neck masses or thyromegaly noted. Cardiac exam with regular rhythm normal S1 and S2. No murmur or gallop noted. Lungs are clear to auscultation. Abdomen is symmetrical soft and nontender with organomegaly or masses. No LE edema or clubbing noted.  Labs/studies Results:   CBC Latest Ref Rng & Units 05/08/2021 12/11/2020 12/09/2020  WBC 4.0 - 10.5 K/uL 6.1 4.6 4.9  Hemoglobin 12.0 - 15.0 g/dL 13.8 14.1 13.1  Hematocrit 36.0 - 46.0 % 40.5 40.0 37.5  Platelets 150 - 400 K/uL 246 324 302    CMP Latest Ref Rng & Units 05/08/2021 12/11/2020 12/09/2020  Glucose 70 - 99 mg/dL 234(H) 241(H) 368(H)  BUN 8 - 23 mg/dL _1 Creatinine 0.44 - 1.00 mg/dL 0.85 0.71 0.89  Sodium 135 - 145 mmol/L 128(L) 129(L) 122(L)  Potassium 3.5 - 5.1 mmol/L 4.1 4.0 5.2(H)  Chloride 98 - 111 mmol/L 94(L) 96(L) 90(L)  CO2 22 - 32 mmol/L 25 25 21(L)  Calcium 8.9 - 10.3 mg/dL 8.6(L) 9.3 9.2  Total Protein 6.5 - 8.1 g/dL - 7.0 6.8  Total Bilirubin 0.3 - 1.2 mg/dL - 0.8 0.9  Alkaline Phos 38 - 126 U/L - 63 66  AST 15 - 41 U/L - 19 20  ALT 0 - 44 U/L - 17 17    Hepatic Function Latest Ref Rng & Units 12/11/2020 12/09/2020 11/04/2020  Total Protein 6.5 - 8.1 g/dL 7.0 6.8 7.8  Albumin 3.5 - 5.0 g/dL 3.7 4.0 4.4  AST 15 - 41 U/L _2 ALT 0 - 44 U/L _3 Alk Phosphatase 38 - 126 U/L 63 66 77  Total Bilirubin 0.3 - 1.2 mg/dL 0.8 0.9 1.1     Assessment:  #1.  Chronic constipation.  She denies constipation due to diabetes mellitus or it is idiopathic.  She is doing well with therapy.  #2.  Patient is high risk for colon carcinoma.  She had adenoma removed about 11 years ago.  2 of her sisters had colon carcinoma.  She has been reluctant to undergo  surveillance colonoscopy for fear of hypoglycemia.  She is agreeable to doing Hemoccults.   Plan:  Continue Linzess/linaclotide at a dose of 72 mcg by mouth every other day.  Patient was given samples. She will continue high-fiber diet. Hemoccult x3.  Patient advised to hold off aspirin for at least 3 days and also avoid eating red meat for the same duration. Office visit in 1 year.

## 2021-06-07 ENCOUNTER — Telehealth (INDEPENDENT_AMBULATORY_CARE_PROVIDER_SITE_OTHER): Payer: Self-pay

## 2021-06-07 NOTE — Telephone Encounter (Signed)
° °  Diagnosis:    Result(s)   Card 1: 06/02/2021 - Negative:     Card 2: 06/04/2021 - Negative:   Card 3: 06/06/2021 - Negative:    Completed by: Gustavus Bryant, CMA   HEMOCCULT SENSA DEVELOPER   LOT#:  [id number]   EXPIRATION DATE: 11/2022   HEMOCCULT SENSA CARD:    LOT#:  [id number]   EXPIRATION DATE: 07/2022   CARD CONTROL RESULTS:   POSITIVE +  NEGATIVE -    ADDITIONAL COMMENTS:  I called and left a message negative x three for blood and that I would forward results to [place] for further recommendations if there were any.

## 2021-07-11 DIAGNOSIS — H6123 Impacted cerumen, bilateral: Secondary | ICD-10-CM | POA: Diagnosis not present

## 2021-07-11 DIAGNOSIS — J343 Hypertrophy of nasal turbinates: Secondary | ICD-10-CM | POA: Diagnosis not present

## 2021-07-11 DIAGNOSIS — J310 Chronic rhinitis: Secondary | ICD-10-CM | POA: Diagnosis not present

## 2021-07-11 DIAGNOSIS — J342 Deviated nasal septum: Secondary | ICD-10-CM | POA: Diagnosis not present

## 2021-07-31 DIAGNOSIS — E1142 Type 2 diabetes mellitus with diabetic polyneuropathy: Secondary | ICD-10-CM | POA: Diagnosis not present

## 2021-07-31 DIAGNOSIS — I10 Essential (primary) hypertension: Secondary | ICD-10-CM | POA: Diagnosis not present

## 2021-08-16 DIAGNOSIS — Z961 Presence of intraocular lens: Secondary | ICD-10-CM | POA: Diagnosis not present

## 2021-08-16 DIAGNOSIS — E119 Type 2 diabetes mellitus without complications: Secondary | ICD-10-CM | POA: Diagnosis not present

## 2021-08-16 DIAGNOSIS — H35372 Puckering of macula, left eye: Secondary | ICD-10-CM | POA: Diagnosis not present

## 2021-09-12 DIAGNOSIS — R300 Dysuria: Secondary | ICD-10-CM | POA: Diagnosis not present

## 2021-09-20 ENCOUNTER — Emergency Department (HOSPITAL_COMMUNITY): Payer: Medicare Other

## 2021-09-20 ENCOUNTER — Encounter (HOSPITAL_COMMUNITY): Payer: Self-pay

## 2021-09-20 ENCOUNTER — Other Ambulatory Visit: Payer: Self-pay

## 2021-09-20 ENCOUNTER — Emergency Department (HOSPITAL_COMMUNITY)
Admission: EM | Admit: 2021-09-20 | Discharge: 2021-09-20 | Disposition: A | Discharge status: Home / Self Care | Payer: Medicare Other | Attending: Emergency Medicine | Admitting: Emergency Medicine

## 2021-09-20 DIAGNOSIS — R42 Dizziness and giddiness: Secondary | ICD-10-CM | POA: Diagnosis not present

## 2021-09-20 DIAGNOSIS — E1165 Type 2 diabetes mellitus with hyperglycemia: Secondary | ICD-10-CM | POA: Insufficient documentation

## 2021-09-20 DIAGNOSIS — U071 COVID-19: Secondary | ICD-10-CM | POA: Insufficient documentation

## 2021-09-20 DIAGNOSIS — R519 Headache, unspecified: Secondary | ICD-10-CM | POA: Diagnosis not present

## 2021-09-20 DIAGNOSIS — I10 Essential (primary) hypertension: Secondary | ICD-10-CM | POA: Diagnosis not present

## 2021-09-20 DIAGNOSIS — Z7984 Long term (current) use of oral hypoglycemic drugs: Secondary | ICD-10-CM | POA: Insufficient documentation

## 2021-09-20 DIAGNOSIS — Z794 Long term (current) use of insulin: Secondary | ICD-10-CM | POA: Insufficient documentation

## 2021-09-20 DIAGNOSIS — Z7982 Long term (current) use of aspirin: Secondary | ICD-10-CM | POA: Insufficient documentation

## 2021-09-20 DIAGNOSIS — R55 Syncope and collapse: Secondary | ICD-10-CM | POA: Diagnosis not present

## 2021-09-20 LAB — CBC
HCT: 40 % (ref 36.0–46.0)
Hemoglobin: 13.9 g/dL (ref 12.0–15.0)
MCH: 31.7 pg (ref 26.0–34.0)
MCHC: 34.8 g/dL (ref 30.0–36.0)
MCV: 91.3 fL (ref 80.0–100.0)
Platelets: 220 10*3/uL (ref 150–400)
RBC: 4.38 MIL/uL (ref 3.87–5.11)
RDW: 13.2 % (ref 11.5–15.5)
WBC: 7.7 10*3/uL (ref 4.0–10.5)
nRBC: 0 % (ref 0.0–0.2)

## 2021-09-20 LAB — URINALYSIS, ROUTINE W REFLEX MICROSCOPIC
Bilirubin Urine: NEGATIVE
Glucose, UA: 150 mg/dL — AB
Hgb urine dipstick: NEGATIVE
Ketones, ur: NEGATIVE mg/dL
Leukocytes,Ua: NEGATIVE
Nitrite: NEGATIVE
Protein, ur: NEGATIVE mg/dL
Specific Gravity, Urine: 1.005 (ref 1.005–1.030)
pH: 8 (ref 5.0–8.0)

## 2021-09-20 LAB — BASIC METABOLIC PANEL
Anion gap: 9 (ref 5–15)
BUN: 22 mg/dL (ref 8–23)
CO2: 26 mmol/L (ref 22–32)
Calcium: 9.2 mg/dL (ref 8.9–10.3)
Chloride: 99 mmol/L (ref 98–111)
Creatinine, Ser: 0.89 mg/dL (ref 0.44–1.00)
GFR, Estimated: >60 mL/min (ref >60)
Glucose, Bld: 241 mg/dL — ABNORMAL HIGH (ref 70–99)
Potassium: 3.6 mmol/L (ref 3.5–5.1)
Sodium: 134 mmol/L — ABNORMAL LOW (ref 135–145)

## 2021-09-20 LAB — RESP PANEL BY RT-PCR (FLU A&B, COVID) ARPGX2
Influenza A by PCR: NEGATIVE
Influenza B by PCR: NEGATIVE
SARS Coronavirus 2 by RT PCR: POSITIVE — AB

## 2021-09-20 LAB — CBG MONITORING, ED
Glucose-Capillary: 246 mg/dL — ABNORMAL HIGH (ref 70–99)
Glucose-Capillary: 232 mg/dL — ABNORMAL HIGH (ref 70–99)
Glucose-Capillary: 364 mg/dL — ABNORMAL HIGH (ref 70–99)

## 2021-09-20 MED ORDER — SODIUM CHLORIDE 0.9 % IV BOLUS
1000.0000 mL | Freq: Once | INTRAVENOUS | Status: AC
  Administered 2021-09-20: 1000 mL via INTRAVENOUS

## 2021-09-20 MED ORDER — ACETAMINOPHEN 500 MG PO TABS
1000.0000 mg | ORAL_TABLET | Freq: Once | ORAL | Status: AC
  Administered 2021-09-20: 1000 mg via ORAL
  Filled 2021-09-20 (×2): qty 2

## 2021-09-20 MED ORDER — NIRMATRELVIR/RITONAVIR (PAXLOVID)TABLET
3.0000 | ORAL_TABLET | Freq: Two times a day (BID) | ORAL | 0 refills | Status: AC
Start: 2021-09-20 — End: 2021-09-25

## 2021-09-20 NOTE — ED Triage Notes (Addendum)
Patient states her CBG at home was 374 and she took 38 units Novolog mixed 70/30. ?CBG in triage-256. Patient reports that she had a syncopal episode this AM and the husband caught her. ? ?Patient also c/o cloudy urine. ? ?Patient c/o headache and nausea. Patient states she had a syncopal episode at home this AM. ?

## 2021-09-20 NOTE — ED Provider Notes (Signed)
?[facility] DEPT ?Provider Note ? ? ?CSN: [id number] ?Arrival date & time: 09/20/21  1002 ? ?  ? ?History ? ?Chief Complaint  ?Patient presents with  ? Hyperglycemia  ? Loss of Consciousness  ? ? ?Kelly Weaver is a 88 y.o. female. ? ?HPI ? ?  ?88 year old female comes in with chief complaint of elevated blood sugar and near fainting. ? ?Patient here with her husband. ? ?She has history of diabetes, hypertension, hyperlipidemia and had COVID-19 in December.  Patient states that she woke up this morning and just did not feel right.  She had a headache yesterday and it persisted today.  She also had some nausea.  While in the bathroom, she suddenly became profoundly weak, felt dizzy, nauseated -and then according to the husband passed out.  He was fortunately around her and was able to ease her onto the floor.  Patient did not have any trauma. ? ?Patient has diabetes.  Husband decided to check her urine, and it showed some ketones, which got them concerned. ? ?At no point did patient have any chest pain, shortness of breath.  She does feel like she is having "flu", but that her symptoms this time around are not as severe as she had with COVID-19 in December. ? ?Home Medications ?Prior to Admission medications   ?Medication Sig Start Date End Date Taking? Authorizing Provider  ?nirmatrelvir/ritonavir EUA (PAXLOVID) 20 x 150 MG & 10 x '100MG'$  TABS Take 3 tablets by mouth 2 (two) times daily for 5 days. Patient GFR is > 60. ?Take nirmatrelvir (150 mg) two tablets twice daily for 5 days and ritonavir (100 mg) one tablet twice daily for 5 days. 09/20/21 09/25/21 Yes Varney Biles, MD  ?aspirin EC 81 MG tablet Take 1 tablet (81 mg total) by mouth in the morning. 08/18/20   Rogene Houston, MD  ?Calcium Carbonate-Vitamin D 600-200 MG-UNIT TABS Take 2 tablets by mouth daily.    [provider]  ?candesartan (ATACAND) 4 MG tablet Take 4 mg by mouth in the morning. 05/11/18   [provider]  ?docusate sodium (COLACE) 100 MG capsule Take 300 mg by mouth at bedtime.    [provider]  ?gabapentin (NEURONTIN) 300 MG capsule Take 300-600 mg by mouth See admin instructions. Take 1 capsule (300 mg) by mouth in the morning & take 2 capsules (600 mg) by mouth at night.    [provider]  ?glucose blood test strip 1 each by Other route as needed. Use as instructed    [provider]  ?insulin aspart (NOVOLOG) 100 UNIT/ML injection Inject 3-4 Units into the skin daily as needed for high blood sugar. This is as needed per sliding scales.    [provider]  ?insulin NPH-regular Human (70-30) 100 UNIT/ML injection Inject 16-36 Units into the skin See admin instructions. Inject 38 units subcutaneously in the morning & inject 17 units subcutaneously in the evening 06/25/16   [provider]  ?Lancets (ONETOUCH DELICA PLUS [id number]) [place] USE 1 TO CHECK GLUCOSE 4 TIMES DAILY 08/05/18   [provider]  ?linaclotide Rolan Lipa) 72 MCG capsule Take 1 capsule (72 mcg total) by mouth daily before breakfast. 11/07/20 05/29/21  Harvel Quale, MD  ?loratadine (CLARITIN) 10 MG tablet Take 10 mg by mouth daily as needed for allergies.    [provider]  ?metFORMIN (GLUCOPHAGE-XR) 500 MG 24 hr tablet Take 500 mg by mouth daily with breakfast. 05/25/20   [provider]  ?Multiple Vitamins-Minerals (PRESERVISION AREDS) CAPS Take 1 capsule by mouth in the morning. 03/06/18   [provider]  ?Omega-3 Fatty Acids (FISH OIL) 1000 MG CAPS Take 1,000 mg by mouth every evening.    [provider]  ?rosuvastatin (CRESTOR) 5 MG tablet Take 5 mg by mouth every evening.    [provider]  ?   ? ?Allergies    ?Ace inhibitors, Ceftin [cefuroxime], Doxycycline, Fluconazole, and Simvastatin   ? ?Review of Systems   ?Review of Systems  ?All other systems reviewed and are negative. ? ?Physical Exam ?Updated Vital Signs ?BP  (!) 128/57   Pulse 72   Temp 97.9 ?F (36.6 ?C) (Oral)   Resp 16   Ht '5\' 5"'$  (1.651 m)   Wt 76.7 kg   SpO2 98%   BMI 28.12 kg/m?  ?Physical Exam ?Vitals and nursing note reviewed.  ?Constitutional:   ?   Appearance: She is well-developed.  ?HENT:  ?   Head: Atraumatic.  ?Eyes:  ?   Extraocular Movements: Extraocular movements intact.  ?   Pupils: Pupils are equal, round, and reactive to light.  ?Cardiovascular:  ?   Rate and Rhythm: Normal rate.  ?Pulmonary:  ?   Effort: Pulmonary effort is normal.  ?Musculoskeletal:  ?   Cervical back: Normal range of motion and neck supple.  ?   Right lower leg: No edema.  ?   Left lower leg: No edema.  ?Skin: ?   General: Skin is warm and dry.  ?Neurological:  ?   Mental Status: She is alert and oriented to person, place, and time.  ?   Cranial Nerves: No cranial nerve deficit.  ?   Sensory: No sensory deficit.  ?   Motor: No weakness.  ?   Coordination: Coordination normal.  ? ? ?ED Results / Procedures / Treatments   ?Labs ?(all labs ordered are listed, but only abnormal results are displayed) ?Labs Reviewed  ?RESP PANEL BY RT-PCR (FLU A&B, COVID) ARPGX2 - Abnormal; Notable for the following components:  ?    Result Value  ? SARS Coronavirus 2 by RT PCR POSITIVE (*)   ? All other components within normal limits  ?BASIC METABOLIC PANEL - Abnormal; Notable for the following components:  ? Sodium 134 (*)   ? Glucose, Bld 241 (*)   ? All other components within normal limits  ?URINALYSIS, ROUTINE W REFLEX MICROSCOPIC - Abnormal; Notable for the following components:  ? Glucose, UA 150 (*)   ? All other components within normal limits  ?CBG MONITORING, ED - Abnormal; Notable for the following components:  ? Glucose-Capillary 246 (*)   ? All other components within normal limits  ?CBG MONITORING, ED - Abnormal; Notable for the following components:  ? Glucose-Capillary 232 (*)   ? All other components within normal limits  ?CBG MONITORING, ED - Abnormal; Notable for the  following components:  ? Glucose-Capillary 364 (*)   ? All other components within normal limits  ?CBC  ? ? ?EKG ?EKG Interpretation ? ?Date/Time:  Thursday September 20 2021 10:28:38 EDT ?Ventricular Rate:  85 ?PR Interval:  304 ?QRS Duration: 90 ?QT Interval:  376 ?QTC Calculation: 448 ?R Axis:   -20 ?Text Interpretation: Sinus rhythm Atrial premature complex Prolonged PR interval Borderline left axis deviation Borderline T wave abnormalities No acute changes Confirmed by Varney Biles [phone]) on 09/20/2021 11:20:47 AM ? ?Radiology ?CT Head Wo Contrast ? ?Result Date: 09/20/2021 ?CLINICAL DATA:  Headache,  new or worsening (Age >= 50y) EXAM: CT HEAD WITHOUT CONTRAST TECHNIQUE: Contiguous axial images were obtained from the base of the skull through the vertex without intravenous contrast. RADIATION DOSE REDUCTION: This exam was performed according to the departmental dose-optimization program which includes automated exposure control, adjustment of the mA and/or kV according to patient size and/or use of iterative reconstruction technique. COMPARISON:  MRI head April 01, 2019. FINDINGS: Brain: No evidence of acute large vascular territory infarction, hemorrhage, hydrocephalus, extra-axial collection or mass lesion/mass effect. Mild patchy white matter hypodensities, nonspecific but compatible with chronic microvascular ischemic disease. Vascular: No hyperdense vessel identified. Calcific intracranial atherosclerosis. Skull: No acute fracture. Sinuses/Orbits: Visualized sinuses are clear. Small right maxillary sinus, partially imaged. No acute orbital findings. Other: No mastoid effusions. IMPRESSION: 1. No evidence of acute intracranial abnormality. 2. Mild chronic microvascular ischemic disease. Electronically Signed   By: Margaretha Sheffield M.D.   On: 09/20/2021 13:02   ? ?Procedures ?Procedures  ? ? ?Medications Ordered in ED ?Medications  ?sodium chloride 0.9 % bolus 1,000 mL (0 mLs Intravenous Stopped 09/20/21  1556)  ?acetaminophen (TYLENOL) tablet 1,000 mg (1,000 mg Oral Given 09/20/21 1202)  ? ? ?ED Course/ Medical Decision Making/ A&P ?  ?                        ?Medical Decision Making ?Amount and/or Complexity of Data Reviewed

## 2021-09-20 NOTE — Discharge Instructions (Addendum)
You are seen in the ER for weakness, near fainting.  Your sugar is also slightly high. ? ?The work-up in the ER is reassuring besides COVID-19 test, which is positive. ? ?We suspect that your weakness, near fainting today, elevated blood sugar are all related to having COVID-19. ? ?Start taking Paxlovid.  Ensure you are hydrating well.  Return to the ER if your symptoms get worse. ?

## 2021-09-24 DIAGNOSIS — Z20822 Contact with and (suspected) exposure to covid-19: Secondary | ICD-10-CM | POA: Diagnosis not present

## 2021-11-19 DIAGNOSIS — E7849 Other hyperlipidemia: Secondary | ICD-10-CM | POA: Diagnosis not present

## 2021-11-19 DIAGNOSIS — E782 Mixed hyperlipidemia: Secondary | ICD-10-CM | POA: Diagnosis not present

## 2021-11-19 DIAGNOSIS — E1142 Type 2 diabetes mellitus with diabetic polyneuropathy: Secondary | ICD-10-CM | POA: Diagnosis not present

## 2021-11-19 DIAGNOSIS — I10 Essential (primary) hypertension: Secondary | ICD-10-CM | POA: Diagnosis not present

## 2021-11-19 DIAGNOSIS — E559 Vitamin D deficiency, unspecified: Secondary | ICD-10-CM | POA: Diagnosis not present

## 2021-11-19 DIAGNOSIS — R946 Abnormal results of thyroid function studies: Secondary | ICD-10-CM | POA: Diagnosis not present

## 2021-11-22 DIAGNOSIS — G6289 Other specified polyneuropathies: Secondary | ICD-10-CM | POA: Diagnosis not present

## 2021-11-22 DIAGNOSIS — E7849 Other hyperlipidemia: Secondary | ICD-10-CM | POA: Diagnosis not present

## 2021-11-22 DIAGNOSIS — I10 Essential (primary) hypertension: Secondary | ICD-10-CM | POA: Diagnosis not present

## 2021-11-22 DIAGNOSIS — E1142 Type 2 diabetes mellitus with diabetic polyneuropathy: Secondary | ICD-10-CM | POA: Diagnosis not present

## 2021-11-22 DIAGNOSIS — Z6831 Body mass index (BMI) 31.0-31.9, adult: Secondary | ICD-10-CM | POA: Diagnosis not present

## 2021-11-22 DIAGNOSIS — E1165 Type 2 diabetes mellitus with hyperglycemia: Secondary | ICD-10-CM | POA: Diagnosis not present

## 2021-12-11 DIAGNOSIS — E1165 Type 2 diabetes mellitus with hyperglycemia: Secondary | ICD-10-CM | POA: Diagnosis not present

## 2022-01-09 DIAGNOSIS — J310 Chronic rhinitis: Secondary | ICD-10-CM | POA: Diagnosis not present

## 2022-01-09 DIAGNOSIS — J343 Hypertrophy of nasal turbinates: Secondary | ICD-10-CM | POA: Diagnosis not present

## 2022-01-09 DIAGNOSIS — J342 Deviated nasal septum: Secondary | ICD-10-CM | POA: Diagnosis not present

## 2022-01-09 DIAGNOSIS — H6123 Impacted cerumen, bilateral: Secondary | ICD-10-CM | POA: Diagnosis not present

## 2022-01-28 DIAGNOSIS — H0102B Squamous blepharitis left eye, upper and lower eyelids: Secondary | ICD-10-CM | POA: Diagnosis not present

## 2022-01-28 DIAGNOSIS — H35372 Puckering of macula, left eye: Secondary | ICD-10-CM | POA: Diagnosis not present

## 2022-01-28 DIAGNOSIS — E119 Type 2 diabetes mellitus without complications: Secondary | ICD-10-CM | POA: Diagnosis not present

## 2022-01-28 DIAGNOSIS — Z7984 Long term (current) use of oral hypoglycemic drugs: Secondary | ICD-10-CM | POA: Diagnosis not present

## 2022-01-28 DIAGNOSIS — H35363 Drusen (degenerative) of macula, bilateral: Secondary | ICD-10-CM | POA: Diagnosis not present

## 2022-01-28 DIAGNOSIS — H0288A Meibomian gland dysfunction right eye, upper and lower eyelids: Secondary | ICD-10-CM | POA: Diagnosis not present

## 2022-01-28 DIAGNOSIS — Z794 Long term (current) use of insulin: Secondary | ICD-10-CM | POA: Diagnosis not present

## 2022-01-28 DIAGNOSIS — Z961 Presence of intraocular lens: Secondary | ICD-10-CM | POA: Diagnosis not present

## 2022-01-28 DIAGNOSIS — H524 Presbyopia: Secondary | ICD-10-CM | POA: Diagnosis not present

## 2022-01-28 DIAGNOSIS — H5212 Myopia, left eye: Secondary | ICD-10-CM | POA: Diagnosis not present

## 2022-01-28 DIAGNOSIS — H5201 Hypermetropia, right eye: Secondary | ICD-10-CM | POA: Diagnosis not present

## 2022-01-28 DIAGNOSIS — H0102A Squamous blepharitis right eye, upper and lower eyelids: Secondary | ICD-10-CM | POA: Diagnosis not present

## 2022-01-28 DIAGNOSIS — H52203 Unspecified astigmatism, bilateral: Secondary | ICD-10-CM | POA: Diagnosis not present

## 2022-01-28 DIAGNOSIS — H18451 Nodular corneal degeneration, right eye: Secondary | ICD-10-CM | POA: Diagnosis not present

## 2022-01-28 DIAGNOSIS — H0288B Meibomian gland dysfunction left eye, upper and lower eyelids: Secondary | ICD-10-CM | POA: Diagnosis not present

## 2022-01-31 DIAGNOSIS — R030 Elevated blood-pressure reading, without diagnosis of hypertension: Secondary | ICD-10-CM | POA: Diagnosis not present

## 2022-01-31 DIAGNOSIS — J019 Acute sinusitis, unspecified: Secondary | ICD-10-CM | POA: Diagnosis not present

## 2022-01-31 DIAGNOSIS — Z6831 Body mass index (BMI) 31.0-31.9, adult: Secondary | ICD-10-CM | POA: Diagnosis not present

## 2022-01-31 DIAGNOSIS — R059 Cough, unspecified: Secondary | ICD-10-CM | POA: Diagnosis not present

## 2022-03-15 DIAGNOSIS — M79662 Pain in left lower leg: Secondary | ICD-10-CM | POA: Diagnosis not present

## 2022-03-15 DIAGNOSIS — I80209 Phlebitis and thrombophlebitis of unspecified deep vessels of unspecified lower extremity: Secondary | ICD-10-CM | POA: Diagnosis not present

## 2022-03-15 DIAGNOSIS — I80202 Phlebitis and thrombophlebitis of unspecified deep vessels of left lower extremity: Secondary | ICD-10-CM | POA: Diagnosis not present

## 2022-03-15 DIAGNOSIS — Z6831 Body mass index (BMI) 31.0-31.9, adult: Secondary | ICD-10-CM | POA: Diagnosis not present

## 2022-03-21 DIAGNOSIS — H0102A Squamous blepharitis right eye, upper and lower eyelids: Secondary | ICD-10-CM | POA: Diagnosis not present

## 2022-03-21 DIAGNOSIS — H0288A Meibomian gland dysfunction right eye, upper and lower eyelids: Secondary | ICD-10-CM | POA: Diagnosis not present

## 2022-03-21 DIAGNOSIS — H0102B Squamous blepharitis left eye, upper and lower eyelids: Secondary | ICD-10-CM | POA: Diagnosis not present

## 2022-03-21 DIAGNOSIS — H35363 Drusen (degenerative) of macula, bilateral: Secondary | ICD-10-CM | POA: Diagnosis not present

## 2022-03-21 DIAGNOSIS — H0288B Meibomian gland dysfunction left eye, upper and lower eyelids: Secondary | ICD-10-CM | POA: Diagnosis not present

## 2022-03-21 DIAGNOSIS — Z961 Presence of intraocular lens: Secondary | ICD-10-CM | POA: Diagnosis not present

## 2022-03-21 DIAGNOSIS — E119 Type 2 diabetes mellitus without complications: Secondary | ICD-10-CM | POA: Diagnosis not present

## 2022-03-21 DIAGNOSIS — H18451 Nodular corneal degeneration, right eye: Secondary | ICD-10-CM | POA: Diagnosis not present

## 2022-03-21 DIAGNOSIS — H35372 Puckering of macula, left eye: Secondary | ICD-10-CM | POA: Diagnosis not present

## 2022-03-21 DIAGNOSIS — Z7984 Long term (current) use of oral hypoglycemic drugs: Secondary | ICD-10-CM | POA: Diagnosis not present

## 2022-03-21 DIAGNOSIS — Z794 Long term (current) use of insulin: Secondary | ICD-10-CM | POA: Diagnosis not present

## 2022-04-04 DIAGNOSIS — Z23 Encounter for immunization: Secondary | ICD-10-CM | POA: Diagnosis not present

## 2022-04-16 ENCOUNTER — Telehealth (INDEPENDENT_AMBULATORY_CARE_PROVIDER_SITE_OTHER): Payer: Self-pay | Admitting: Endocrinology

## 2022-04-16 NOTE — Telephone Encounter (Signed)
I spoke with the patient and told her we would put her some 72 mcg of the Linzess samples at the front desk,but noticed she did not have an upcoming appointment and the last time she was seen was 05/24/2021. I transferred the patient to the front desk to schedule an appointment.

## 2022-04-16 NOTE — Telephone Encounter (Signed)
Appt was scheduled for patient.

## 2022-04-16 NOTE — Telephone Encounter (Signed)
Patient is in need of samples of   linaclotide (LINZESS) 72 MCG capsule  Will come pick up tomorrow.

## 2022-04-17 DIAGNOSIS — I8002 Phlebitis and thrombophlebitis of superficial vessels of left lower extremity: Secondary | ICD-10-CM | POA: Diagnosis not present

## 2022-04-17 DIAGNOSIS — E1342 Other specified diabetes mellitus with diabetic polyneuropathy: Secondary | ICD-10-CM | POA: Diagnosis not present

## 2022-04-17 DIAGNOSIS — E1065 Type 1 diabetes mellitus with hyperglycemia: Secondary | ICD-10-CM | POA: Diagnosis not present

## 2022-04-17 DIAGNOSIS — Z794 Long term (current) use of insulin: Secondary | ICD-10-CM | POA: Diagnosis not present

## 2022-04-17 DIAGNOSIS — I10 Essential (primary) hypertension: Secondary | ICD-10-CM | POA: Diagnosis not present

## 2022-04-17 DIAGNOSIS — E7800 Pure hypercholesterolemia, unspecified: Secondary | ICD-10-CM | POA: Diagnosis not present

## 2022-04-17 DIAGNOSIS — E7849 Other hyperlipidemia: Secondary | ICD-10-CM | POA: Diagnosis not present

## 2022-04-17 DIAGNOSIS — Z6831 Body mass index (BMI) 31.0-31.9, adult: Secondary | ICD-10-CM | POA: Diagnosis not present

## 2022-04-17 DIAGNOSIS — R202 Paresthesia of skin: Secondary | ICD-10-CM | POA: Diagnosis not present

## 2022-04-17 DIAGNOSIS — Z9641 Presence of insulin pump (external) (internal): Secondary | ICD-10-CM | POA: Diagnosis not present

## 2022-04-17 DIAGNOSIS — Z23 Encounter for immunization: Secondary | ICD-10-CM | POA: Diagnosis not present

## 2022-04-17 DIAGNOSIS — E02 Subclinical iodine-deficiency hypothyroidism: Secondary | ICD-10-CM | POA: Diagnosis not present

## 2022-04-17 DIAGNOSIS — E1142 Type 2 diabetes mellitus with diabetic polyneuropathy: Secondary | ICD-10-CM | POA: Diagnosis not present

## 2022-04-30 ENCOUNTER — Ambulatory Visit (INDEPENDENT_AMBULATORY_CARE_PROVIDER_SITE_OTHER): Payer: Medicare Other | Admitting: Gastroenterology

## 2022-04-30 ENCOUNTER — Encounter (INDEPENDENT_AMBULATORY_CARE_PROVIDER_SITE_OTHER): Payer: Self-pay | Admitting: Gastroenterology

## 2022-04-30 VITALS — BP 110/68 | HR 74 | Temp 97.9°F | Ht 65.0 in | Wt 173.0 lb

## 2022-04-30 DIAGNOSIS — K5904 Chronic idiopathic constipation: Secondary | ICD-10-CM

## 2022-04-30 NOTE — Progress Notes (Addendum)
Referring Provider: Jacelyn Pi, MD Primary Care Physician:  Jacelyn Pi, MD Primary GI Physician: previously [facility]   Chief Complaint  Patient presents with   Constipation    Follow up on constipation. Takes linzess and needs samples due to insurance not covering.    HPI:   Kelly Weaver is a 88 y.o. female with past medical history of arthritis, DM, DVT, HLD, neuropathy, PVD.   Patient presenting today for follow up of constipation.  Last seen December 2022, at that time doing well, taking linzess every other day with good results. Declined to update Colonoscopy given previous issues with hyperosmolar state/DKA after prep and preferred to do hemoccults. Hemoccult x3 ordered, continued on linzess 59mg samples (insurance does not cover), continue high fiber diet. Hemoccult cards were not done   Present:  Patient states that she is taking linzess every other day, having a BM every day to every other day. Doing well on the medication. Denies abdominal pain, rectal bleeding, melena. No changes in appetite or weight loss. No melena. She has no GI complaints today. She did not complete stool cards at last visit, she is not interested in updating colonoscopy for screening purposes.   Last Colonoscopy:2011, tubular adenomas, patient declined further colonoscopy  Last Endoscopy: 08/17/20 - Normal hypopharynx. - Normal esophagus. - Z-line regular, 38 cm from the incisors. - No endoscopic esophageal abnormality to explain patient's dysphagia. Esophagus dilated. Dilated. Biopsied. - Erosive gastropathy with no stigmata of recent bleeding. Biopsied. - Duodenal erosions without bleeding. - Normal second portion of the duodenum.   Past Medical History:  Diagnosis Date   Arthritis    Complication of anesthesia    Diabetes mellitus    DVT (deep venous thrombosis) ([facility] 2001   left  took Lovenox injections   Edema    Hyperlipidemia    Neuropathy    Peripheral vascular disease  (HCC)    PONV (postoperative nausea and vomiting)    Thrombophlebitis    Varicose veins     Past Surgical History:  Procedure Laterality Date   BACK SURGERY  01-10-2011   removed bone chip  between L4-L5     per pt's husband   BIOPSY N/A 08/17/2020   Procedure: BIOPSY;  Surgeon: RRogene Houston MD;  [facility];  Service: Endoscopy;  Laterality: N/A;   ESOPHAGEAL DILATION N/A 08/17/2020   Procedure: ESOPHAGEAL DILATION;  Surgeon: RRogene Houston MD;  Location: [facility];  Service: Endoscopy;  Laterality: N/A;   ESOPHAGOGASTRODUODENOSCOPY N/A 08/17/2020   Procedure: ESOPHAGOGASTRODUODENOSCOPY (EGD);  Surgeon: RRogene Houston MD;  Location: [facility];  Service: Endoscopy;  Laterality: N/A;  patient wants early AM   EYE SURGERY Left    scar tissue removed   FRACTURE SURGERY Left 1993   [place]   TOTAL KNEE ARTHROPLASTY Left 11/02/2012   Procedure: TOTAL KNEE ARTHROPLASTY;  Surgeon: SVickey Huger MD;  [facility]  Service: Orthopedics;  Laterality: Left;   TOTAL KNEE ARTHROPLASTY Left 11/03/2012   Dr LRonnie Derby   Current Outpatient Medications  Medication Sig Dispense Refill   Apixaban (ELIQUIS PO) Take by mouth. One bid     Calcium Carbonate-Vitamin D 600-200 MG-UNIT TABS Take 2 tablets by mouth daily.     candesartan (ATACAND) 4 MG tablet Take 4 mg by mouth in the morning.     docusate sodium (COLACE) 100 MG capsule Take 300 mg by  mouth at bedtime.     gabapentin (NEURONTIN) 300 MG capsule Take 300-600 mg by mouth See admin instructions. Take 1 capsule (300 mg) by mouth in the morning & take 2 capsules (600 mg) by mouth at night.     glucose blood test strip 1 each by Other route as needed. Use as instructed. accucheck     Lancets (ONETOUCH DELICA PLUS [id number]) MISC USE 1 TO CHECK GLUCOSE 4 TIMES DAILY     linaclotide (LINZESS) 72 MCG capsule Take 1 capsule (72 mcg total) by mouth daily before  breakfast. 90 capsule 1   loratadine (CLARITIN) 10 MG tablet Take 10 mg by mouth daily as needed for allergies.     Multiple Vitamins-Minerals (PRESERVISION AREDS) CAPS Take 1 capsule by mouth in the morning.     Omega-3 Fatty Acids (FISH OIL) 1000 MG CAPS Take 1,000 mg by mouth every evening.     PRESCRIPTION MEDICATION Has insulin pump.     rosuvastatin (CRESTOR) 5 MG tablet Take 5 mg by mouth every evening.     No current facility-administered medications for this visit.    Allergies as of 04/30/2022 - Review Complete 04/30/2022  Allergen Reaction Noted   Ace inhibitors Other (See Comments) 08/18/2020   Ceftin [cefuroxime] Other (See Comments) 08/18/2020   Doxycycline Swelling 10/19/2012   Fluconazole  08/18/2020   Simvastatin Other (See Comments) 08/18/2020    Family History  Problem Relation Age of Onset   Diabetes Sister     Social History   Socioeconomic History   Marital status: Married    Spouse name: Not on file   Number of children: Not on file   Years of education: Not on file   Highest education level: Not on file  Occupational History   Not on file  Tobacco Use   Smoking status: Never   Smokeless tobacco: Never  Vaping Use   Vaping Use: Not on file  Substance and Sexual Activity   Alcohol use: No   Drug use: No   Sexual activity: Not on file  Other Topics Concern   Not on file  Social History Narrative   One level with husband      Right handed   Caffeine - rare      Exercise some walking      Education GED   Social Determinants of Health   Financial Resource Strain: Not on file  Food Insecurity: Not on file  Transportation Needs: Not on file  Physical Activity: Not on file  Stress: Not on file  Social Connections: Not on file   Review of systems General: negative for malaise, night sweats, fever, chills, weight loss Neck: Negative for lumps, goiter, pain and significant neck swelling Resp: Negative for cough, wheezing, dyspnea at  rest CV: Negative for chest pain, leg swelling, palpitations, orthopnea GI: denies melena, hematochezia, nausea, vomiting, diarrhea, constipation, dysphagia, odyonophagia, early satiety or unintentional weight loss.  MSK: Negative for joint pain or swelling, back pain, and muscle pain. Derm: Negative for itching or rash Psych: Denies depression, anxiety, memory loss, confusion. No homicidal or suicidal ideation.  Heme: Negative for prolonged bleeding, bruising easily, and swollen nodes. Endocrine: Negative for cold or heat intolerance, polyuria, polydipsia and goiter. Neuro: negative for tremor, gait imbalance, syncope and seizures. The remainder of the review of systems is noncontributory.  Physical Exam: BP 110/68 (BP Location: Right Arm, Patient Position: Sitting, Cuff Size: Large)   Pulse 74   Temp 97.9 F (36.6 C) (Oral)  Ht '5\' 5"'$  (1.651 m)   Wt 173 lb (78.5 kg)   BMI 28.79 kg/m  General:   Alert and oriented. No distress noted. Pleasant and cooperative.  Head:  Normocephalic and atraumatic. Eyes:  Conjuctiva clear without scleral icterus. Mouth:  Oral mucosa pink and moist. Good dentition. No lesions. Heart: Normal rate and rhythm, s1 and s2 heart sounds present.  Lungs: Clear lung sounds in all lobes. Respirations equal and unlabored. Abdomen:  +BS, soft, non-tender and non-distended. No rebound or guarding. No HSM or masses noted. Derm: No palmar erythema or jaundice Msk:  Symmetrical without gross deformities. Normal posture. Extremities:  Without edema. Neurologic:  Alert and  oriented x4 Psych:  Alert and cooperative. Normal mood and affect.  Invalid input(s): "6 MONTHS"   ASSESSMENT: AAYAT HAJJAR is a 88 y.o. female presenting today for follow up of constipation.  Chronic constipation, well managed on linzess 24mg every other day. She denies abdominal pain, rectal bleeding, melena, weight loss or changes in appetite. Will continue with current regimen, providing  samples as insurance does not cover medication.  Notably, patient with history of tubular adenomas, family history of CRC, last TCS in 2011, she has declined further colonoscopy. Recommended to have occult stool cards x3 at last visit with Dr RLaural Golden though these were not completed, recommended completing occult stool cards at this time.    PLAN:  Continue linzess 73m every other day  2. Good water, fruit, veggie intake 3. Occult stool cards x3  All questions were answered, patient verbalized understanding and is in agreement with plan as outlined above.     Follow Up: 1 year   Gerrica Cygan L. CaAlver SorrowMSN, APRN, AGNP-C Adult-Gerontology Nurse Practitioner [facility]  I have reviewed the note and agree with the APP's assessment as described in this progress note  DaMaylon PeppersMD Gastroenterology and Hepatology CoNewport Coast Surgery Center LPastroenterology

## 2022-04-30 NOTE — Patient Instructions (Signed)
Continue linzess 47mg every other day, make sure water, fruit, and veggie intake are good as well We will do stool cards as previously recommended by Dr. RLaural Golden just complete these and return to our office at your earliest convenience. We will plant to see you in about 1 year unless you have new or worsening GI symptoms

## 2022-04-30 NOTE — Addendum Note (Signed)
Addended by: Harvel Quale on: 04/30/2022 04:22 PM   Modules accepted: Level of Service

## 2022-05-09 DIAGNOSIS — H18451 Nodular corneal degeneration, right eye: Secondary | ICD-10-CM | POA: Diagnosis not present

## 2022-05-09 DIAGNOSIS — E119 Type 2 diabetes mellitus without complications: Secondary | ICD-10-CM | POA: Diagnosis not present

## 2022-05-09 DIAGNOSIS — Z794 Long term (current) use of insulin: Secondary | ICD-10-CM | POA: Diagnosis not present

## 2022-05-09 DIAGNOSIS — Z961 Presence of intraocular lens: Secondary | ICD-10-CM | POA: Diagnosis not present

## 2022-05-09 DIAGNOSIS — H0288A Meibomian gland dysfunction right eye, upper and lower eyelids: Secondary | ICD-10-CM | POA: Diagnosis not present

## 2022-05-09 DIAGNOSIS — H0288B Meibomian gland dysfunction left eye, upper and lower eyelids: Secondary | ICD-10-CM | POA: Diagnosis not present

## 2022-05-09 DIAGNOSIS — H35372 Puckering of macula, left eye: Secondary | ICD-10-CM | POA: Diagnosis not present

## 2022-05-09 DIAGNOSIS — Z7984 Long term (current) use of oral hypoglycemic drugs: Secondary | ICD-10-CM | POA: Diagnosis not present

## 2022-05-13 DIAGNOSIS — H35363 Drusen (degenerative) of macula, bilateral: Secondary | ICD-10-CM | POA: Diagnosis not present

## 2022-05-13 DIAGNOSIS — H0288A Meibomian gland dysfunction right eye, upper and lower eyelids: Secondary | ICD-10-CM | POA: Diagnosis not present

## 2022-05-13 DIAGNOSIS — Z961 Presence of intraocular lens: Secondary | ICD-10-CM | POA: Diagnosis not present

## 2022-05-13 DIAGNOSIS — E119 Type 2 diabetes mellitus without complications: Secondary | ICD-10-CM | POA: Diagnosis not present

## 2022-05-13 DIAGNOSIS — H0288B Meibomian gland dysfunction left eye, upper and lower eyelids: Secondary | ICD-10-CM | POA: Diagnosis not present

## 2022-05-13 DIAGNOSIS — Z794 Long term (current) use of insulin: Secondary | ICD-10-CM | POA: Diagnosis not present

## 2022-05-13 DIAGNOSIS — Z7984 Long term (current) use of oral hypoglycemic drugs: Secondary | ICD-10-CM | POA: Diagnosis not present

## 2022-05-13 DIAGNOSIS — H35372 Puckering of macula, left eye: Secondary | ICD-10-CM | POA: Diagnosis not present

## 2022-05-13 DIAGNOSIS — H18451 Nodular corneal degeneration, right eye: Secondary | ICD-10-CM | POA: Diagnosis not present

## 2022-05-22 DIAGNOSIS — E039 Hypothyroidism, unspecified: Secondary | ICD-10-CM | POA: Diagnosis not present

## 2022-05-22 DIAGNOSIS — E1142 Type 2 diabetes mellitus with diabetic polyneuropathy: Secondary | ICD-10-CM | POA: Diagnosis not present

## 2022-05-22 DIAGNOSIS — E1165 Type 2 diabetes mellitus with hyperglycemia: Secondary | ICD-10-CM | POA: Diagnosis not present

## 2022-05-22 DIAGNOSIS — E7849 Other hyperlipidemia: Secondary | ICD-10-CM | POA: Diagnosis not present

## 2022-05-22 DIAGNOSIS — I10 Essential (primary) hypertension: Secondary | ICD-10-CM | POA: Diagnosis not present

## 2022-05-22 DIAGNOSIS — E782 Mixed hyperlipidemia: Secondary | ICD-10-CM | POA: Diagnosis not present

## 2022-05-22 DIAGNOSIS — E559 Vitamin D deficiency, unspecified: Secondary | ICD-10-CM | POA: Diagnosis not present

## 2022-05-22 DIAGNOSIS — Z0001 Encounter for general adult medical examination with abnormal findings: Secondary | ICD-10-CM | POA: Diagnosis not present

## 2022-05-22 DIAGNOSIS — F17210 Nicotine dependence, cigarettes, uncomplicated: Secondary | ICD-10-CM | POA: Diagnosis not present

## 2022-05-22 DIAGNOSIS — Z1329 Encounter for screening for other suspected endocrine disorder: Secondary | ICD-10-CM | POA: Diagnosis not present

## 2022-05-23 ENCOUNTER — Ambulatory Visit (INDEPENDENT_AMBULATORY_CARE_PROVIDER_SITE_OTHER): Payer: Medicare Other | Admitting: Gastroenterology

## 2022-05-28 DIAGNOSIS — I10 Essential (primary) hypertension: Secondary | ICD-10-CM | POA: Diagnosis not present

## 2022-05-28 DIAGNOSIS — E1142 Type 2 diabetes mellitus with diabetic polyneuropathy: Secondary | ICD-10-CM | POA: Diagnosis not present

## 2022-05-28 DIAGNOSIS — E1342 Other specified diabetes mellitus with diabetic polyneuropathy: Secondary | ICD-10-CM | POA: Diagnosis not present

## 2022-05-28 DIAGNOSIS — Z6831 Body mass index (BMI) 31.0-31.9, adult: Secondary | ICD-10-CM | POA: Diagnosis not present

## 2022-05-28 DIAGNOSIS — I8002 Phlebitis and thrombophlebitis of superficial vessels of left lower extremity: Secondary | ICD-10-CM | POA: Diagnosis not present

## 2022-05-28 DIAGNOSIS — E7849 Other hyperlipidemia: Secondary | ICD-10-CM | POA: Diagnosis not present

## 2022-05-31 DIAGNOSIS — Z96653 Presence of artificial knee joint, bilateral: Secondary | ICD-10-CM | POA: Diagnosis not present

## 2022-06-06 ENCOUNTER — Other Ambulatory Visit (INDEPENDENT_AMBULATORY_CARE_PROVIDER_SITE_OTHER): Payer: Self-pay | Admitting: *Deleted

## 2022-06-06 ENCOUNTER — Telehealth (INDEPENDENT_AMBULATORY_CARE_PROVIDER_SITE_OTHER): Payer: Medicare Other | Admitting: *Deleted

## 2022-06-06 DIAGNOSIS — K5900 Constipation, unspecified: Secondary | ICD-10-CM

## 2022-06-06 LAB — POC HEMOCCULT BLD/STL (HOME/3-CARD/SCREEN)
Card #2 Fecal Occult Blod, POC: NEGATIVE
Card #3 Fecal Occult Blood, POC: NEGATIVE
Fecal Occult Blood, POC: NEGATIVE

## 2022-06-12 DIAGNOSIS — Z23 Encounter for immunization: Secondary | ICD-10-CM | POA: Diagnosis not present

## 2022-06-13 NOTE — Telephone Encounter (Signed)
error 

## 2022-06-25 IMAGING — CT CT ABD-PELV W/ CM
2 of 5 series · 16 of 46 positions shown, 18 images · IV contrast (omnipaque)
Comparison: 06/28/2017

CLINICAL DATA: Abdominal pain, recent endoscopy

EXAM:
CT ABDOMEN AND PELVIS WITH CONTRAST
TECHNIQUE: Multidetector CT imaging of the abdomen and pelvis was performed
using the standard protocol following bolus administration of
intravenous contrast.
CONTRAST:  100mL OMNIPAQUE IOHEXOL 300 MG/ML  SOLN

[Series 2: axial st · axial · 0.80mm/px · z∈[+973,+1333]mm · 13 of 86 slices shown, 15 images]
[im 7/86  soft-tissue]
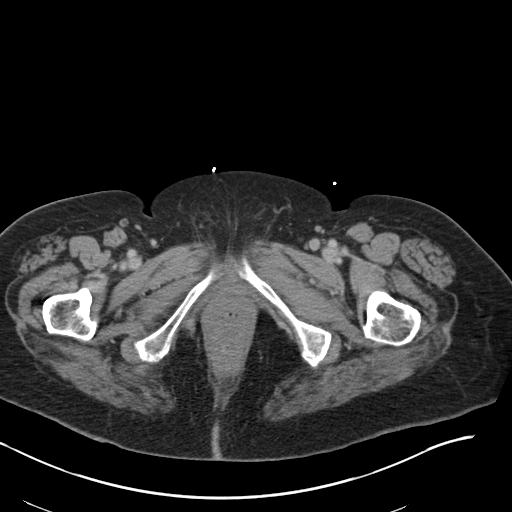
[im 7/86  bone]
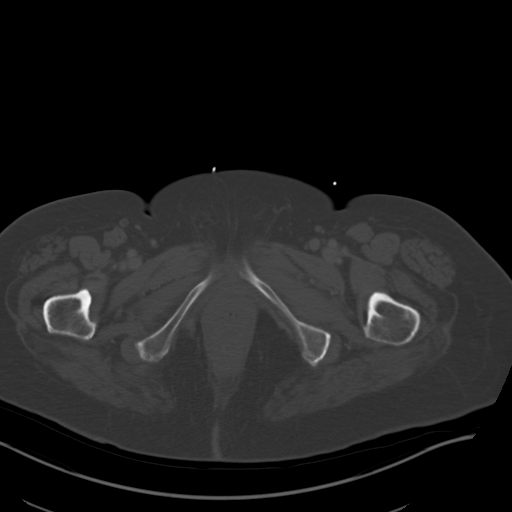
[im 13/86  soft-tissue]
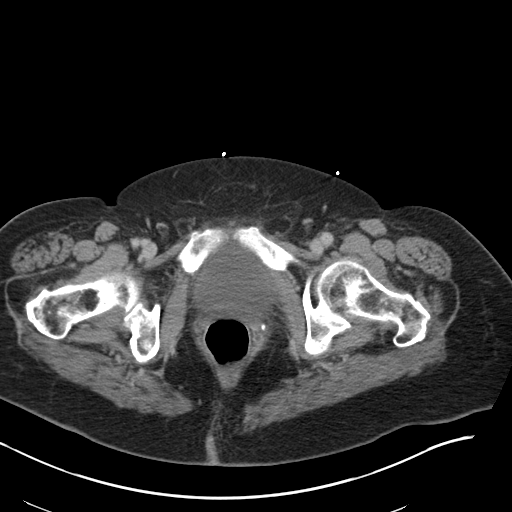
[im 19/86  soft-tissue]
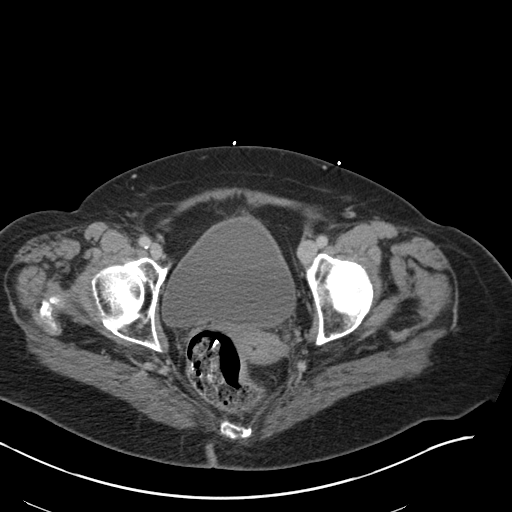
[im 25/86  soft-tissue]
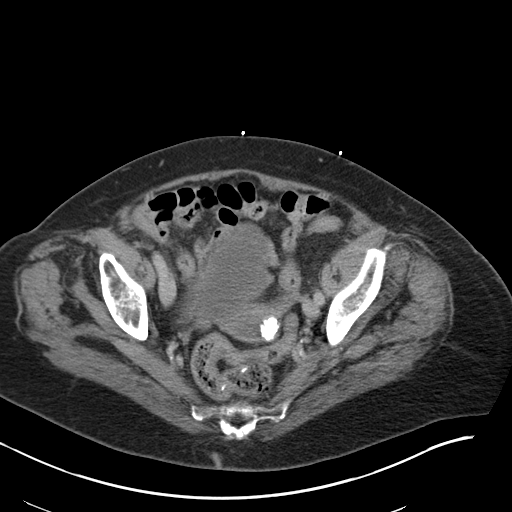
[im 31/86  soft-tissue]
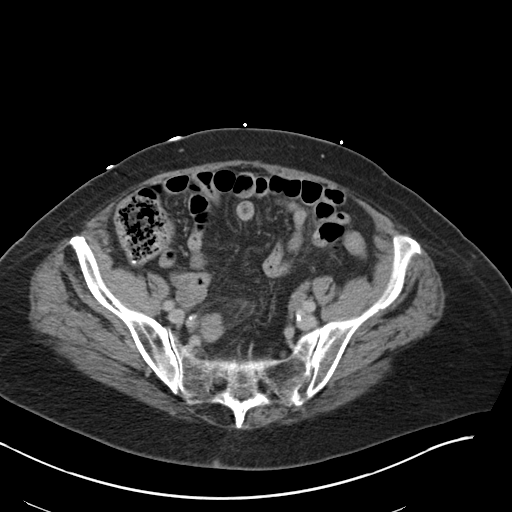
[im 37/86  soft-tissue]
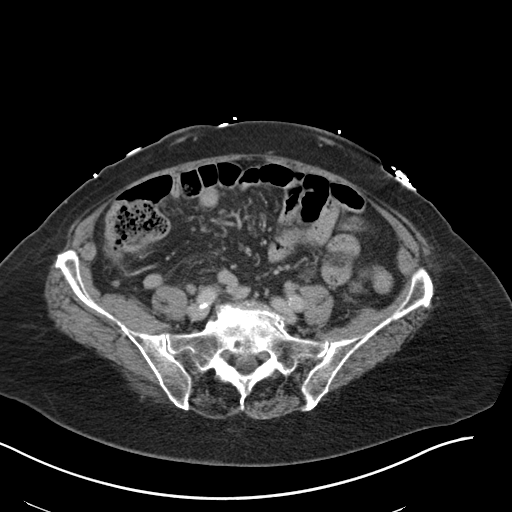
[im 43/86  soft-tissue]
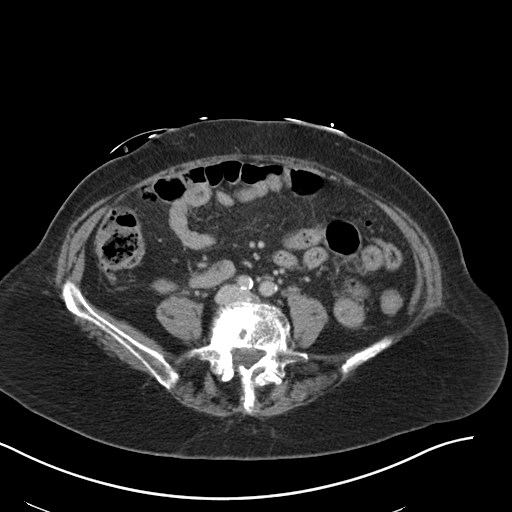
[im 49/86  soft-tissue]
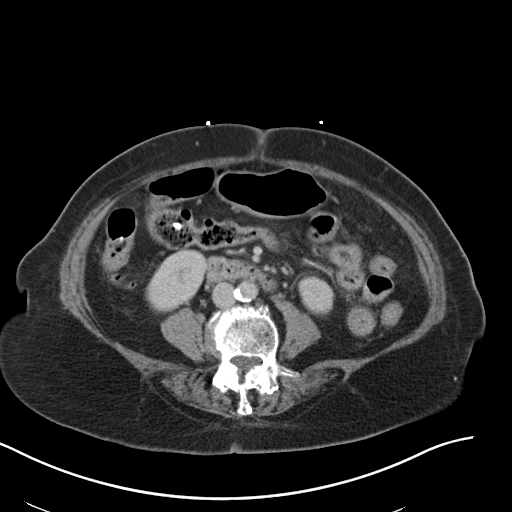
[im 55/86  soft-tissue]
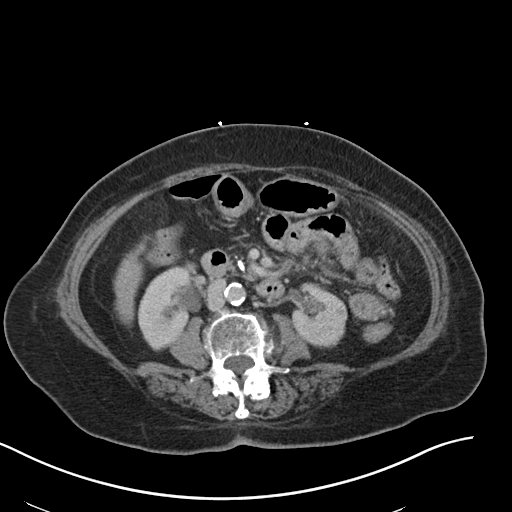
[im 55/86  bone]
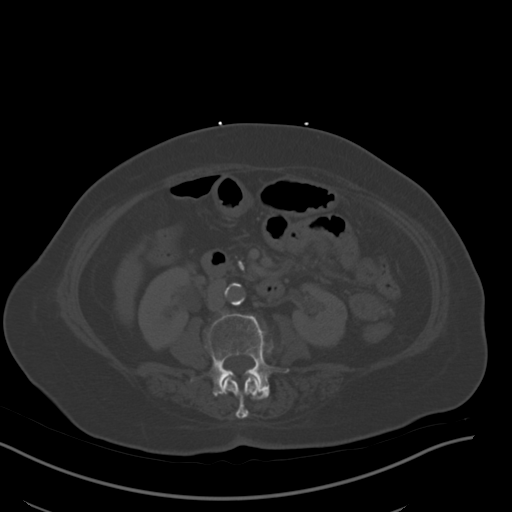
[im 61/86  soft-tissue]
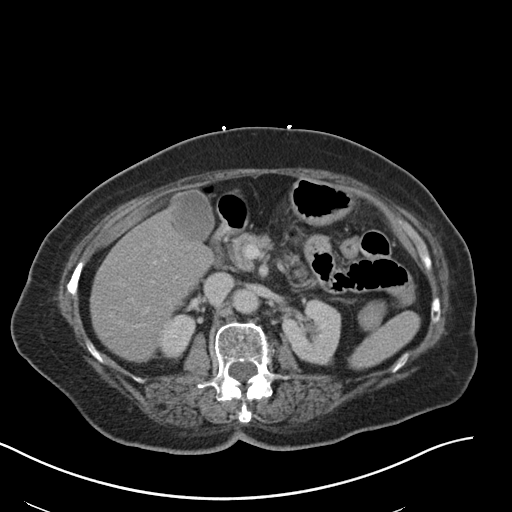
[im 67/86  soft-tissue]
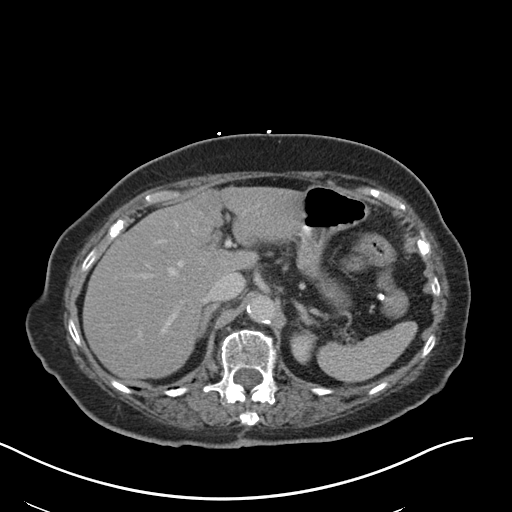
[im 73/86  soft-tissue]
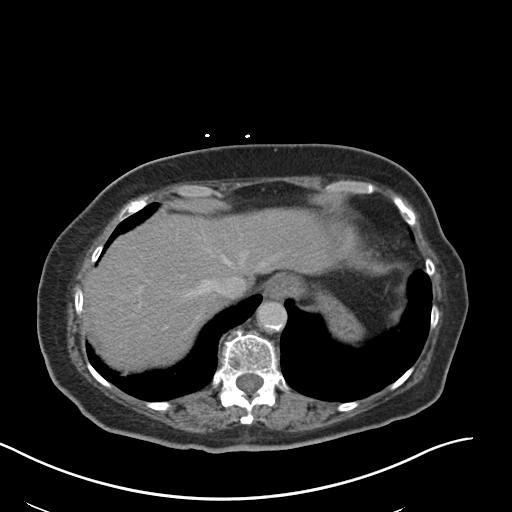
[im 79/86  soft-tissue]
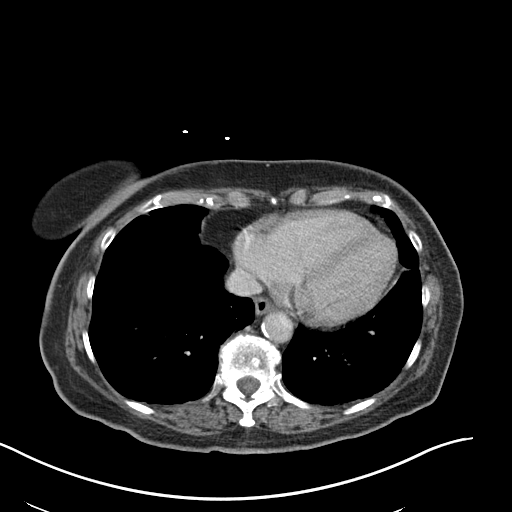

[Series 5: coronal st · coronal · 0.82mm/px · 3 of 141 slices shown]
[im 47/141  soft-tissue]
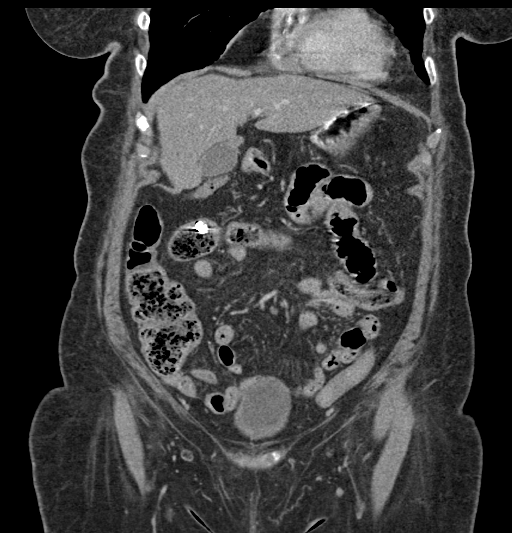
[im 63/141  soft-tissue]
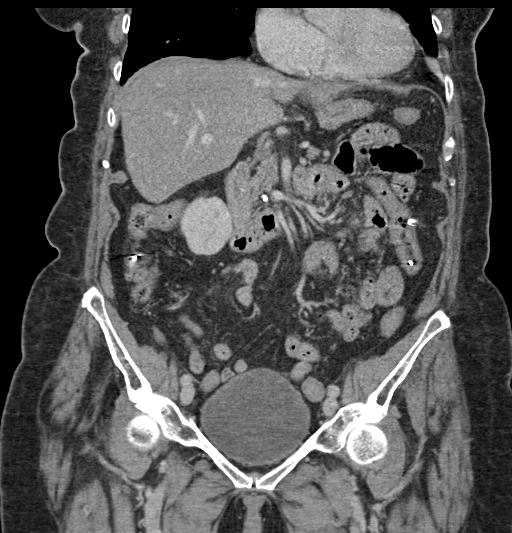
[im 78/141  soft-tissue]
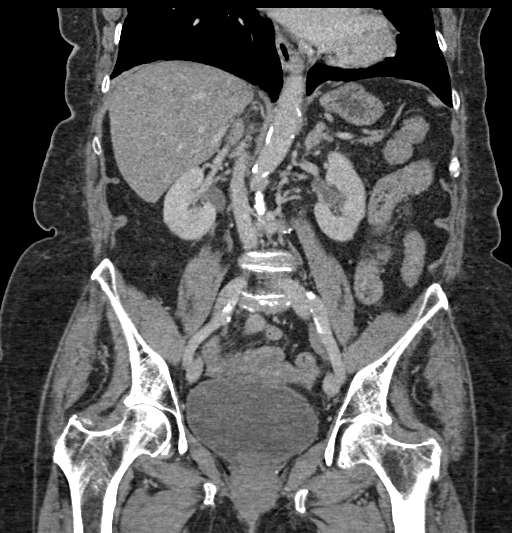

[16 of 46 positions shown; findings below may reference images not displayed]

FINDINGS: Lower chest: Scarring of the included bilateral lung bases. Coronary
artery calcifications and/or stents.

Hepatobiliary: Unchanged brightly contrast enhancing subcapsular
mass of the inferior left lobe of the liver measuring 2.5 cm (series
2, image 23). Stable, benign intermediate attenuation lesion of the
inferior tip of the right lobe of the liver (series 2, image 30). No
gallstones, gallbladder wall thickening, or biliary dilatation.

Pancreas: Calcifications in the pancreatic head. No pancreatic
ductal dilatation or surrounding inflammatory changes.

Spleen: Normal in size without significant abnormality.

Adrenals/Urinary Tract: Adrenal glands are unremarkable. Kidneys are
normal, without renal calculi, solid lesion, or hydronephrosis.
Bladder is unremarkable.

Stomach/Bowel: Stomach is within normal limits. Appendix appears
normal. No evidence of bowel wall thickening, distention, or
inflammatory changes. Occasional sigmoid diverticula.

Vascular/Lymphatic: Aortic atherosclerosis. No enlarged abdominal or
pelvic lymph nodes.

Reproductive: Calcified uterine fibroids.

Other: No abdominal wall hernia or abnormality. No abdominopelvic
ascites.

Musculoskeletal: No acute or significant osseous findings.
IMPRESSION: 1. No acute CT findings of the abdomen or pelvis to explain
abdominal pain. Specifically, there is no evidence of complication
of recent endoscopy such as bowel perforation.
2. Unchanged brightly contrast enhancing subcapsular mass of the
inferior left lobe of the liver measuring 2.5 cm. This has been
stable for greater than 3 years and is likely a benign focal nodular
hyperplasia or flash filling hemangioma.
3. Calcifications in the pancreatic head, consistent sequelae of
prior pancreatitis.
4. Coronary artery disease.

Aortic Atherosclerosis (8UTDI-9NH.H).

## 2022-07-12 DIAGNOSIS — Z9189 Other specified personal risk factors, not elsewhere classified: Secondary | ICD-10-CM | POA: Diagnosis not present

## 2022-07-12 DIAGNOSIS — E1342 Other specified diabetes mellitus with diabetic polyneuropathy: Secondary | ICD-10-CM | POA: Diagnosis not present

## 2022-07-12 DIAGNOSIS — E1110 Type 2 diabetes mellitus with ketoacidosis without coma: Secondary | ICD-10-CM | POA: Diagnosis not present

## 2022-07-12 DIAGNOSIS — R300 Dysuria: Secondary | ICD-10-CM | POA: Diagnosis not present

## 2022-07-14 ENCOUNTER — Other Ambulatory Visit: Payer: Self-pay

## 2022-07-14 ENCOUNTER — Emergency Department (HOSPITAL_COMMUNITY)
Admission: EM | Admit: 2022-07-14 | Discharge: 2022-07-14 | Disposition: A | Discharge status: Home / Self Care | Payer: Medicare Other | Attending: Emergency Medicine | Admitting: Emergency Medicine

## 2022-07-14 DIAGNOSIS — Z7901 Long term (current) use of anticoagulants: Secondary | ICD-10-CM | POA: Diagnosis not present

## 2022-07-14 DIAGNOSIS — E119 Type 2 diabetes mellitus without complications: Secondary | ICD-10-CM | POA: Insufficient documentation

## 2022-07-14 DIAGNOSIS — E871 Hypo-osmolality and hyponatremia: Secondary | ICD-10-CM | POA: Diagnosis not present

## 2022-07-14 DIAGNOSIS — R309 Painful micturition, unspecified: Secondary | ICD-10-CM | POA: Diagnosis present

## 2022-07-14 DIAGNOSIS — Z1152 Encounter for screening for COVID-19: Secondary | ICD-10-CM | POA: Diagnosis not present

## 2022-07-14 DIAGNOSIS — Z8616 Personal history of COVID-19: Secondary | ICD-10-CM | POA: Insufficient documentation

## 2022-07-14 DIAGNOSIS — N390 Urinary tract infection, site not specified: Secondary | ICD-10-CM | POA: Diagnosis not present

## 2022-07-14 LAB — BASIC METABOLIC PANEL
Anion gap: 12 (ref 5–15)
BUN: 17 mg/dL (ref 8–23)
CO2: 23 mmol/L (ref 22–32)
Calcium: 9 mg/dL (ref 8.9–10.3)
Chloride: 92 mmol/L — ABNORMAL LOW (ref 98–111)
Creatinine, Ser: 0.8 mg/dL (ref 0.44–1.00)
GFR, Estimated: >60 mL/min (ref >60)
Glucose, Bld: 176 mg/dL — ABNORMAL HIGH (ref 70–99)
Potassium: 3.8 mmol/L (ref 3.5–5.1)
Sodium: 127 mmol/L — ABNORMAL LOW (ref 135–145)

## 2022-07-14 LAB — RESP PANEL BY RT-PCR (RSV, FLU A&B, COVID)  RVPGX2
Influenza A by PCR: NEGATIVE
Influenza B by PCR: NEGATIVE
Resp Syncytial Virus by PCR: NEGATIVE
SARS Coronavirus 2 by RT PCR: NEGATIVE

## 2022-07-14 LAB — CBC WITH DIFFERENTIAL/PLATELET
Abs Immature Granulocytes: 0.02 10*3/uL (ref 0.00–0.07)
Basophils Absolute: 0.1 10*3/uL (ref 0.0–0.1)
Basophils Relative: 1 %
Eosinophils Absolute: 0.2 10*3/uL (ref 0.0–0.5)
Eosinophils Relative: 3 %
HCT: 40.2 % (ref 36.0–46.0)
Hemoglobin: 13.7 g/dL (ref 12.0–15.0)
Immature Granulocytes: 0 %
Lymphocytes Relative: 10 %
Lymphs Abs: 0.8 10*3/uL (ref 0.7–4.0)
MCH: 30.4 pg (ref 26.0–34.0)
MCHC: 34.1 g/dL (ref 30.0–36.0)
MCV: 89.1 fL (ref 80.0–100.0)
Monocytes Absolute: 0.7 10*3/uL (ref 0.1–1.0)
Monocytes Relative: 9 %
Neutro Abs: 6.1 10*3/uL (ref 1.7–7.7)
Neutrophils Relative %: 77 %
Platelets: 255 10*3/uL (ref 150–400)
RBC: 4.51 MIL/uL (ref 3.87–5.11)
RDW: 12.8 % (ref 11.5–15.5)
WBC: 7.8 10*3/uL (ref 4.0–10.5)
nRBC: 0 % (ref 0.0–0.2)

## 2022-07-14 LAB — URINALYSIS, ROUTINE W REFLEX MICROSCOPIC
Bilirubin Urine: NEGATIVE
Glucose, UA: NEGATIVE mg/dL
Hgb urine dipstick: NEGATIVE
Ketones, ur: 20 mg/dL — AB
Nitrite: NEGATIVE
Protein, ur: NEGATIVE mg/dL
Specific Gravity, Urine: 1.004 — ABNORMAL LOW (ref 1.005–1.030)
pH: 7 (ref 5.0–8.0)

## 2022-07-14 MED ORDER — AMOXICILLIN 500 MG PO CAPS: 500.0000 mg | ORAL_CAPSULE | Freq: Three times a day (TID) | ORAL | 0 refills | Status: DC

## 2022-07-14 MED ORDER — ONDANSETRON 8 MG PO TBDP: 8.0000 mg | ORAL_TABLET | Freq: Three times a day (TID) | ORAL | 0 refills | Status: DC | PRN

## 2022-07-14 MED ORDER — ONDANSETRON HCL 4 MG/2ML IJ SOLN
4.0000 mg | Freq: Once | INTRAMUSCULAR | Status: AC
  Administered 2022-07-14: 4 mg via INTRAVENOUS
  Filled 2022-07-14: qty 2

## 2022-07-14 MED ORDER — SODIUM CHLORIDE 0.9 % IV BOLUS
1000.0000 mL | Freq: Once | INTRAVENOUS | Status: AC
  Administered 2022-07-14: 1000 mL via INTRAVENOUS

## 2022-07-14 MED ORDER — AMOXICILLIN 500 MG PO CAPS: 500.0000 mg | ORAL_CAPSULE | Freq: Three times a day (TID) | ORAL | 0 refills | Status: AC

## 2022-07-14 NOTE — ED Notes (Signed)
Patient taken to restroom by Lissa Hoard. Pt able to get up with assistance well.

## 2022-07-14 NOTE — ED Provider Notes (Signed)
[facility] AT [facility] Provider Note   CSN: [id number] Arrival date & time: 07/14/22  [id number]     History  Chief Complaint  Patient presents with   Nausea    Kelly Weaver is a 89 y.o. female.  HPI     89 year old patient with insulin-dependent diabetes comes in with chief complaint of nausea, feeling weak.  Patient states that on 3 days ago she started having burning with urination.  She called her PCP 2 days ago, they prescribed her Macrobid.  She took 2 doses of Macrobid, but her symptoms worsened.  She suspected that the nausea was worse because of Macrobid.  In the past she has taken amoxicillin for UTI.  Patient continues to have urinary frequency, burning with urination.  She also complains of some epigastric abdominal pain.  There is no emesis.  Patient denies any fevers, chills, URI-like symptoms.  Home Medications Prior to Admission medications   Medication Sig Start Date End Date Taking? Authorizing Provider  amoxicillin (AMOXIL) 500 MG capsule Take 1 capsule (500 mg total) by mouth 3 (three) times daily. 07/14/22  Yes Lindee Leason, MD  ondansetron (ZOFRAN-ODT) 8 MG disintegrating tablet Take 1 tablet (8 mg total) by mouth every 8 (eight) hours as needed for nausea. 07/14/22  Yes Varney Biles, MD  Apixaban (ELIQUIS PO) Take by mouth. One bid    [provider]  Calcium Carbonate-Vitamin D 600-200 MG-UNIT TABS Take 2 tablets by mouth daily.    [provider]  candesartan (ATACAND) 4 MG tablet Take 4 mg by mouth in the morning. 05/11/18   [provider]  docusate sodium (COLACE) 100 MG capsule Take 300 mg by mouth at bedtime.    [provider]  gabapentin (NEURONTIN) 300 MG capsule Take 300-600 mg by mouth See admin instructions. Take 1 capsule (300 mg) by mouth in the morning & take 2 capsules (600 mg) by mouth at night.    [provider]  glucose blood test strip 1 each by Other route as  needed. Use as instructed. accucheck    [provider]  Lancets (ONETOUCH DELICA PLUS 123XX123) [place] USE 1 TO CHECK GLUCOSE 4 TIMES DAILY 08/05/18   [provider]  linaclotide (LINZESS) 72 MCG capsule Take 1 capsule (72 mcg total) by mouth daily before breakfast. 11/07/20 04/30/22  Harvel Quale, MD  loratadine (CLARITIN) 10 MG tablet Take 10 mg by mouth daily as needed for allergies.    [provider]  Multiple Vitamins-Minerals (PRESERVISION AREDS) CAPS Take 1 capsule by mouth in the morning. 03/06/18   [provider]  Omega-3 Fatty Acids (FISH OIL) 1000 MG CAPS Take 1,000 mg by mouth every evening.    [provider]  PRESCRIPTION MEDICATION Has insulin pump.    [provider]  rosuvastatin (CRESTOR) 5 MG tablet Take 5 mg by mouth every evening.    [provider]      Allergies    Ace inhibitors, Ceftin [cefuroxime], Doxycycline, Fluconazole, and Simvastatin    Review of Systems   Review of Systems  Constitutional:  Positive for activity change.  Respiratory:  Negative for shortness of breath.   Cardiovascular:  Negative for chest pain.  Gastrointestinal:  Positive for abdominal pain and nausea. Negative for vomiting.  Genitourinary:  Positive for dysuria.  Neurological:  Negative for headaches.  All other systems reviewed and are negative.   Physical Exam Updated Vital Signs BP 121/62   Pulse  60   Temp 98.2 F (36.8 C) (Oral)   Resp 18   Ht 5' 5"$  (1.651 m)   Wt 78 kg   SpO2 96%   BMI 28.62 kg/m  Physical Exam Vitals and nursing note reviewed.  Constitutional:      Appearance: She is well-developed.  HENT:     Head: Atraumatic.  Cardiovascular:     Rate and Rhythm: Normal rate.  Pulmonary:     Effort: Pulmonary effort is normal.  Musculoskeletal:     Cervical back: Normal range of motion and neck supple.  Skin:    General: Skin is warm and dry.  Neurological:     Mental Status: She is  alert and oriented to person, place, and time.     ED Results / Procedures / Treatments   Labs (all labs ordered are listed, but only abnormal results are displayed) Labs Reviewed  URINALYSIS, ROUTINE W REFLEX MICROSCOPIC - Abnormal; Notable for the following components:      Result Value   Color, Urine STRAW (*)    Specific Gravity, Urine 1.004 (*)    Ketones, ur 20 (*)    Leukocytes,Ua SMALL (*)    Bacteria, UA RARE (*)    All other components within normal limits  BASIC METABOLIC PANEL - Abnormal; Notable for the following components:   Sodium 127 (*)    Chloride 92 (*)    Glucose, Bld 176 (*)    All other components within normal limits  RESP PANEL BY RT-PCR (RSV, FLU A&B, COVID)  [id number]  CBC WITH DIFFERENTIAL/PLATELET    EKG None  Radiology No results found.  Procedures Procedures    Medications Ordered in ED Medications  sodium chloride 0.9 % bolus 1,000 mL (0 mLs Intravenous Stopped 07/14/22 0915)  ondansetron (ZOFRAN) injection 4 mg (4 mg Intravenous Given 07/14/22 [id number])    ED Course/ Medical Decision Making/ A&P Clinical Course as of 07/14/22 1059  Sun Jul 14, 2022  0900 The patient appears reasonably screened and/or stabilized for discharge and I doubt any other medical condition or other [facility] requiring further screening, evaluation, or treatment in the ED at this time prior to discharge.   Results from the ER workup discussed with the patient face to face and all questions answered to the best of my ability. The patient is safe for discharge with strict return precautions. [AN]    Clinical Course User Index [AN] Varney Biles, MD                             Medical Decision Making Risk Prescription drug management.   89 year old patient comes in with chief complaint of urinary frequency and burning with urination, nausea.  Patient has history of insulin dependent diabetes.  Patient has 0 SIRS criteria at arrival.  She has epigastric tenderness  without any rebound or guarding.  Differential diagnosis for this patient includes UTI, pyelonephritis, cholelithiasis, pancreatitis, gastritis.  She has an insulin pump, therefore low likelihood of DKA. Dehydration, electrolyte abnormality also possible.  Clinically patient is not septic, she has 0 SIRS criteria on arrival.  Abdominal exam is overall reassuring, no peritonitis.  No lower quadrant tenderness.  Epigastric tenderness will be reassessed to see if we need to get CT scan, but at this time there does not appear to be a surgical process.  I reviewed patient previous charts.  She had come in with weakness and was found to have COVID-19 last  year.  At this time she has no URI-like symptoms. Patient's husband is at the bedside.  He also provides substantive part of the history.  10:59 AM  Pt reassessed. Pt's VSS and WNL. Pt's cap refill < 3 seconds. Pt has been hydrated in the ER and now passed po challenge. We will discharge with antiemetic. Strict ER return precautions have been discussed and pt will return if he is unable to tolerate fluids and symptoms are getting worse.  Advised that we will start her on amoxi, which has worked for her UTI.  Patient does not want to try cephalosporin. Discussed with her the low sodium.  Discussed with her return precautions and the need for PCP follow-up for sodium checkup.  Final Clinical Impression(s) / ED Diagnoses Final diagnoses:  Lower urinary tract infectious disease  Hyponatremia    Rx / DC Orders ED Discharge Orders          Ordered    ondansetron (ZOFRAN-ODT) 8 MG disintegrating tablet  Every 8 hours PRN        07/14/22 1055    amoxicillin (AMOXIL) 500 MG capsule  3 times daily        07/14/22 1055              Varney Biles, MD 07/14/22 1100

## 2022-07-14 NOTE — ED Notes (Addendum)
Pt ate peanut butter crackers, applesauce, and drank diet ginger ale. Pt states they do not feel nauseous at this time.

## 2022-07-14 NOTE — ED Triage Notes (Signed)
Pt presents to ED from home C/O nausea X 2 days. Pt reports dx with UTI at PCP on Friday, prescribed Macrobid. Pt reports only taking two doses before stopping d/t nausea and dizziness.

## 2022-07-14 NOTE — Discharge Instructions (Addendum)
You are seen in the ER for urinary symptoms, weakness, nausea. The workup in the emergency room reveals that your sodium is slightly low at 127.  Rest of your labs, including urine function is reassuring. Urine test is not clearly showing infection, however you have been on antibiotics, therefore urine can be misleading.  Start the amoxicillin for UTI. Hydrate well. Take the nausea medicine as needed.  Please return to the ER if your symptoms worsen; you have increased pain, fevers, chills, inability to keep any medications down, confusion. Otherwise see the outpatient doctor as requested.

## 2022-07-18 DIAGNOSIS — E1165 Type 2 diabetes mellitus with hyperglycemia: Secondary | ICD-10-CM | POA: Diagnosis not present

## 2022-07-18 DIAGNOSIS — Z9641 Presence of insulin pump (external) (internal): Secondary | ICD-10-CM | POA: Diagnosis not present

## 2022-07-18 DIAGNOSIS — E1140 Type 2 diabetes mellitus with diabetic neuropathy, unspecified: Secondary | ICD-10-CM | POA: Diagnosis not present

## 2022-07-18 DIAGNOSIS — I10 Essential (primary) hypertension: Secondary | ICD-10-CM | POA: Diagnosis not present

## 2022-07-18 DIAGNOSIS — E1065 Type 1 diabetes mellitus with hyperglycemia: Secondary | ICD-10-CM | POA: Diagnosis not present

## 2022-07-18 DIAGNOSIS — E02 Subclinical iodine-deficiency hypothyroidism: Secondary | ICD-10-CM | POA: Diagnosis not present

## 2022-07-18 DIAGNOSIS — R202 Paresthesia of skin: Secondary | ICD-10-CM | POA: Diagnosis not present

## 2022-07-18 DIAGNOSIS — Z794 Long term (current) use of insulin: Secondary | ICD-10-CM | POA: Diagnosis not present

## 2022-07-18 DIAGNOSIS — E7800 Pure hypercholesterolemia, unspecified: Secondary | ICD-10-CM | POA: Diagnosis not present

## 2022-08-12 DIAGNOSIS — J342 Deviated nasal septum: Secondary | ICD-10-CM | POA: Diagnosis not present

## 2022-08-12 DIAGNOSIS — J343 Hypertrophy of nasal turbinates: Secondary | ICD-10-CM | POA: Diagnosis not present

## 2022-08-12 DIAGNOSIS — H6123 Impacted cerumen, bilateral: Secondary | ICD-10-CM | POA: Diagnosis not present

## 2022-08-12 DIAGNOSIS — J310 Chronic rhinitis: Secondary | ICD-10-CM | POA: Diagnosis not present

## 2022-10-08 DIAGNOSIS — E1065 Type 1 diabetes mellitus with hyperglycemia: Secondary | ICD-10-CM | POA: Diagnosis not present

## 2022-10-08 DIAGNOSIS — R202 Paresthesia of skin: Secondary | ICD-10-CM | POA: Diagnosis not present

## 2022-10-08 DIAGNOSIS — Z794 Long term (current) use of insulin: Secondary | ICD-10-CM | POA: Diagnosis not present

## 2022-10-08 DIAGNOSIS — E02 Subclinical iodine-deficiency hypothyroidism: Secondary | ICD-10-CM | POA: Diagnosis not present

## 2022-10-08 DIAGNOSIS — E1140 Type 2 diabetes mellitus with diabetic neuropathy, unspecified: Secondary | ICD-10-CM | POA: Diagnosis not present

## 2022-10-08 DIAGNOSIS — E7800 Pure hypercholesterolemia, unspecified: Secondary | ICD-10-CM | POA: Diagnosis not present

## 2022-10-08 DIAGNOSIS — I10 Essential (primary) hypertension: Secondary | ICD-10-CM | POA: Diagnosis not present

## 2022-10-08 DIAGNOSIS — E1165 Type 2 diabetes mellitus with hyperglycemia: Secondary | ICD-10-CM | POA: Diagnosis not present

## 2022-10-08 DIAGNOSIS — Z9641 Presence of insulin pump (external) (internal): Secondary | ICD-10-CM | POA: Diagnosis not present

## 2022-10-14 ENCOUNTER — Ambulatory Visit (INDEPENDENT_AMBULATORY_CARE_PROVIDER_SITE_OTHER): Payer: Medicare Other | Admitting: Gastroenterology

## 2022-11-15 DIAGNOSIS — Z6827 Body mass index (BMI) 27.0-27.9, adult: Secondary | ICD-10-CM | POA: Diagnosis not present

## 2022-11-15 DIAGNOSIS — M542 Cervicalgia: Secondary | ICD-10-CM | POA: Diagnosis not present

## 2022-11-21 DIAGNOSIS — E7849 Other hyperlipidemia: Secondary | ICD-10-CM | POA: Diagnosis not present

## 2022-11-21 DIAGNOSIS — E039 Hypothyroidism, unspecified: Secondary | ICD-10-CM | POA: Diagnosis not present

## 2022-11-21 DIAGNOSIS — E1165 Type 2 diabetes mellitus with hyperglycemia: Secondary | ICD-10-CM | POA: Diagnosis not present

## 2022-11-21 DIAGNOSIS — I10 Essential (primary) hypertension: Secondary | ICD-10-CM | POA: Diagnosis not present

## 2022-11-27 DIAGNOSIS — E7849 Other hyperlipidemia: Secondary | ICD-10-CM | POA: Diagnosis not present

## 2022-11-27 DIAGNOSIS — E1142 Type 2 diabetes mellitus with diabetic polyneuropathy: Secondary | ICD-10-CM | POA: Diagnosis not present

## 2022-11-27 DIAGNOSIS — Z6831 Body mass index (BMI) 31.0-31.9, adult: Secondary | ICD-10-CM | POA: Diagnosis not present

## 2022-11-27 DIAGNOSIS — I10 Essential (primary) hypertension: Secondary | ICD-10-CM | POA: Diagnosis not present

## 2023-01-08 DIAGNOSIS — E1140 Type 2 diabetes mellitus with diabetic neuropathy, unspecified: Secondary | ICD-10-CM | POA: Diagnosis not present

## 2023-01-08 DIAGNOSIS — Z9641 Presence of insulin pump (external) (internal): Secondary | ICD-10-CM | POA: Diagnosis not present

## 2023-01-08 DIAGNOSIS — E1065 Type 1 diabetes mellitus with hyperglycemia: Secondary | ICD-10-CM | POA: Diagnosis not present

## 2023-01-08 DIAGNOSIS — E7800 Pure hypercholesterolemia, unspecified: Secondary | ICD-10-CM | POA: Diagnosis not present

## 2023-01-08 DIAGNOSIS — R202 Paresthesia of skin: Secondary | ICD-10-CM | POA: Diagnosis not present

## 2023-01-08 DIAGNOSIS — E02 Subclinical iodine-deficiency hypothyroidism: Secondary | ICD-10-CM | POA: Diagnosis not present

## 2023-01-08 DIAGNOSIS — I10 Essential (primary) hypertension: Secondary | ICD-10-CM | POA: Diagnosis not present

## 2023-01-08 DIAGNOSIS — Z794 Long term (current) use of insulin: Secondary | ICD-10-CM | POA: Diagnosis not present

## 2023-01-10 DIAGNOSIS — H35372 Puckering of macula, left eye: Secondary | ICD-10-CM | POA: Diagnosis not present

## 2023-01-10 DIAGNOSIS — Z961 Presence of intraocular lens: Secondary | ICD-10-CM | POA: Diagnosis not present

## 2023-01-10 DIAGNOSIS — E119 Type 2 diabetes mellitus without complications: Secondary | ICD-10-CM | POA: Diagnosis not present

## 2023-01-10 DIAGNOSIS — H0288A Meibomian gland dysfunction right eye, upper and lower eyelids: Secondary | ICD-10-CM | POA: Diagnosis not present

## 2023-01-10 DIAGNOSIS — H43813 Vitreous degeneration, bilateral: Secondary | ICD-10-CM | POA: Diagnosis not present

## 2023-01-10 DIAGNOSIS — H18451 Nodular corneal degeneration, right eye: Secondary | ICD-10-CM | POA: Diagnosis not present

## 2023-01-10 DIAGNOSIS — H0288B Meibomian gland dysfunction left eye, upper and lower eyelids: Secondary | ICD-10-CM | POA: Diagnosis not present

## 2023-01-13 DIAGNOSIS — I8312 Varicose veins of left lower extremity with inflammation: Secondary | ICD-10-CM | POA: Diagnosis not present

## 2023-01-13 DIAGNOSIS — I8311 Varicose veins of right lower extremity with inflammation: Secondary | ICD-10-CM | POA: Diagnosis not present

## 2023-01-13 DIAGNOSIS — R600 Localized edema: Secondary | ICD-10-CM | POA: Diagnosis not present

## 2023-01-13 DIAGNOSIS — I83893 Varicose veins of bilateral lower extremities with other complications: Secondary | ICD-10-CM | POA: Diagnosis not present

## 2023-01-13 DIAGNOSIS — M7989 Other specified soft tissue disorders: Secondary | ICD-10-CM | POA: Diagnosis not present

## 2023-01-13 DIAGNOSIS — I83813 Varicose veins of bilateral lower extremities with pain: Secondary | ICD-10-CM | POA: Diagnosis not present

## 2023-01-23 DIAGNOSIS — I872 Venous insufficiency (chronic) (peripheral): Secondary | ICD-10-CM | POA: Diagnosis not present

## 2023-01-24 DIAGNOSIS — Z09 Encounter for follow-up examination after completed treatment for conditions other than malignant neoplasm: Secondary | ICD-10-CM | POA: Diagnosis not present

## 2023-01-24 DIAGNOSIS — I83892 Varicose veins of left lower extremities with other complications: Secondary | ICD-10-CM | POA: Diagnosis not present

## 2023-01-24 DIAGNOSIS — I872 Venous insufficiency (chronic) (peripheral): Secondary | ICD-10-CM | POA: Diagnosis not present

## 2023-01-28 DIAGNOSIS — E02 Subclinical iodine-deficiency hypothyroidism: Secondary | ICD-10-CM | POA: Diagnosis not present

## 2023-01-28 DIAGNOSIS — E1065 Type 1 diabetes mellitus with hyperglycemia: Secondary | ICD-10-CM | POA: Diagnosis not present

## 2023-01-28 DIAGNOSIS — Z9641 Presence of insulin pump (external) (internal): Secondary | ICD-10-CM | POA: Diagnosis not present

## 2023-01-28 DIAGNOSIS — E1140 Type 2 diabetes mellitus with diabetic neuropathy, unspecified: Secondary | ICD-10-CM | POA: Diagnosis not present

## 2023-01-28 DIAGNOSIS — R202 Paresthesia of skin: Secondary | ICD-10-CM | POA: Diagnosis not present

## 2023-01-28 DIAGNOSIS — E7800 Pure hypercholesterolemia, unspecified: Secondary | ICD-10-CM | POA: Diagnosis not present

## 2023-01-28 DIAGNOSIS — I10 Essential (primary) hypertension: Secondary | ICD-10-CM | POA: Diagnosis not present

## 2023-01-28 DIAGNOSIS — Z794 Long term (current) use of insulin: Secondary | ICD-10-CM | POA: Diagnosis not present

## 2023-02-04 DIAGNOSIS — I83892 Varicose veins of left lower extremities with other complications: Secondary | ICD-10-CM | POA: Diagnosis not present

## 2023-02-19 DIAGNOSIS — I83892 Varicose veins of left lower extremities with other complications: Secondary | ICD-10-CM | POA: Diagnosis not present

## 2023-02-19 DIAGNOSIS — I83812 Varicose veins of left lower extremities with pain: Secondary | ICD-10-CM | POA: Diagnosis not present

## 2023-02-19 DIAGNOSIS — M7989 Other specified soft tissue disorders: Secondary | ICD-10-CM | POA: Diagnosis not present

## 2023-02-25 DIAGNOSIS — I872 Venous insufficiency (chronic) (peripheral): Secondary | ICD-10-CM | POA: Diagnosis not present

## 2023-03-04 DIAGNOSIS — Z09 Encounter for follow-up examination after completed treatment for conditions other than malignant neoplasm: Secondary | ICD-10-CM | POA: Diagnosis not present

## 2023-03-04 DIAGNOSIS — I872 Venous insufficiency (chronic) (peripheral): Secondary | ICD-10-CM | POA: Diagnosis not present

## 2023-03-11 DIAGNOSIS — I83811 Varicose veins of right lower extremities with pain: Secondary | ICD-10-CM | POA: Diagnosis not present

## 2023-03-11 DIAGNOSIS — I83891 Varicose veins of right lower extremities with other complications: Secondary | ICD-10-CM | POA: Diagnosis not present

## 2023-03-20 DIAGNOSIS — Z23 Encounter for immunization: Secondary | ICD-10-CM | POA: Diagnosis not present

## 2023-03-25 DIAGNOSIS — I83891 Varicose veins of right lower extremities with other complications: Secondary | ICD-10-CM | POA: Diagnosis not present

## 2023-04-10 DIAGNOSIS — Z9641 Presence of insulin pump (external) (internal): Secondary | ICD-10-CM | POA: Diagnosis not present

## 2023-04-10 DIAGNOSIS — I10 Essential (primary) hypertension: Secondary | ICD-10-CM | POA: Diagnosis not present

## 2023-04-10 DIAGNOSIS — E1065 Type 1 diabetes mellitus with hyperglycemia: Secondary | ICD-10-CM | POA: Diagnosis not present

## 2023-04-10 DIAGNOSIS — E7800 Pure hypercholesterolemia, unspecified: Secondary | ICD-10-CM | POA: Diagnosis not present

## 2023-04-10 DIAGNOSIS — E02 Subclinical iodine-deficiency hypothyroidism: Secondary | ICD-10-CM | POA: Diagnosis not present

## 2023-04-10 DIAGNOSIS — R202 Paresthesia of skin: Secondary | ICD-10-CM | POA: Diagnosis not present

## 2023-04-10 DIAGNOSIS — E1140 Type 2 diabetes mellitus with diabetic neuropathy, unspecified: Secondary | ICD-10-CM | POA: Diagnosis not present

## 2023-04-10 DIAGNOSIS — Z794 Long term (current) use of insulin: Secondary | ICD-10-CM | POA: Diagnosis not present

## 2023-04-15 DIAGNOSIS — U071 COVID-19: Secondary | ICD-10-CM | POA: Diagnosis not present

## 2023-04-28 ENCOUNTER — Encounter (INDEPENDENT_AMBULATORY_CARE_PROVIDER_SITE_OTHER): Payer: Self-pay | Admitting: Gastroenterology

## 2023-04-28 ENCOUNTER — Ambulatory Visit (INDEPENDENT_AMBULATORY_CARE_PROVIDER_SITE_OTHER): Payer: Medicare Other | Admitting: Gastroenterology

## 2023-04-28 VITALS — BP 134/69 | HR 63 | Temp 97.8°F | Ht 65.0 in | Wt 165.6 lb

## 2023-04-28 DIAGNOSIS — K5904 Chronic idiopathic constipation: Secondary | ICD-10-CM
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
## 2023-04-28 NOTE — Patient Instructions (Addendum)
Continue Linzess 72 mcg every day -samples provided #16 Continue with high-fiber diet

## 2023-05-19 ENCOUNTER — Telehealth (INDEPENDENT_AMBULATORY_CARE_PROVIDER_SITE_OTHER): Payer: Self-pay | Admitting: Gastroenterology

## 2023-05-19 NOTE — Telephone Encounter (Signed)
Pt called in and asked for samples of Linzess 72 mcg. Placed 4 bottles up front for pt to pick up

## 2023-05-20 DIAGNOSIS — I8312 Varicose veins of left lower extremity with inflammation: Secondary | ICD-10-CM | POA: Diagnosis not present

## 2023-05-20 DIAGNOSIS — I8311 Varicose veins of right lower extremity with inflammation: Secondary | ICD-10-CM | POA: Diagnosis not present

## 2023-05-20 DIAGNOSIS — I83893 Varicose veins of bilateral lower extremities with other complications: Secondary | ICD-10-CM | POA: Diagnosis not present

## 2023-05-20 DIAGNOSIS — R600 Localized edema: Secondary | ICD-10-CM | POA: Diagnosis not present

## 2023-06-03 DIAGNOSIS — Z1329 Encounter for screening for other suspected endocrine disorder: Secondary | ICD-10-CM | POA: Diagnosis not present

## 2023-06-03 DIAGNOSIS — I10 Essential (primary) hypertension: Secondary | ICD-10-CM | POA: Diagnosis not present

## 2023-06-03 DIAGNOSIS — E1165 Type 2 diabetes mellitus with hyperglycemia: Secondary | ICD-10-CM | POA: Diagnosis not present

## 2023-06-03 DIAGNOSIS — E7849 Other hyperlipidemia: Secondary | ICD-10-CM | POA: Diagnosis not present

## 2023-06-03 DIAGNOSIS — E559 Vitamin D deficiency, unspecified: Secondary | ICD-10-CM | POA: Diagnosis not present

## 2023-06-03 DIAGNOSIS — Z0000 Encounter for general adult medical examination without abnormal findings: Secondary | ICD-10-CM | POA: Diagnosis not present

## 2023-06-10 DIAGNOSIS — Z6830 Body mass index (BMI) 30.0-30.9, adult: Secondary | ICD-10-CM | POA: Diagnosis not present

## 2023-06-10 DIAGNOSIS — Z1389 Encounter for screening for other disorder: Secondary | ICD-10-CM | POA: Diagnosis not present

## 2023-06-10 DIAGNOSIS — I10 Essential (primary) hypertension: Secondary | ICD-10-CM | POA: Diagnosis not present

## 2023-06-10 DIAGNOSIS — Z0001 Encounter for general adult medical examination with abnormal findings: Secondary | ICD-10-CM | POA: Diagnosis not present

## 2023-06-10 DIAGNOSIS — Z1331 Encounter for screening for depression: Secondary | ICD-10-CM | POA: Diagnosis not present

## 2023-06-10 DIAGNOSIS — E1142 Type 2 diabetes mellitus with diabetic polyneuropathy: Secondary | ICD-10-CM | POA: Diagnosis not present

## 2023-06-10 DIAGNOSIS — E7849 Other hyperlipidemia: Secondary | ICD-10-CM | POA: Diagnosis not present

## 2023-06-24 DIAGNOSIS — Z96653 Presence of artificial knee joint, bilateral: Secondary | ICD-10-CM | POA: Diagnosis not present

## 2023-06-30 DIAGNOSIS — I872 Venous insufficiency (chronic) (peripheral): Secondary | ICD-10-CM | POA: Diagnosis not present

## 2023-07-07 DIAGNOSIS — Z09 Encounter for follow-up examination after completed treatment for conditions other than malignant neoplasm: Secondary | ICD-10-CM | POA: Diagnosis not present

## 2023-07-07 DIAGNOSIS — I872 Venous insufficiency (chronic) (peripheral): Secondary | ICD-10-CM | POA: Diagnosis not present

## 2023-07-07 DIAGNOSIS — I83811 Varicose veins of right lower extremities with pain: Secondary | ICD-10-CM | POA: Diagnosis not present

## 2023-07-11 DIAGNOSIS — E02 Subclinical iodine-deficiency hypothyroidism: Secondary | ICD-10-CM | POA: Diagnosis not present

## 2023-07-11 DIAGNOSIS — I10 Essential (primary) hypertension: Secondary | ICD-10-CM | POA: Diagnosis not present

## 2023-07-11 DIAGNOSIS — R202 Paresthesia of skin: Secondary | ICD-10-CM | POA: Diagnosis not present

## 2023-07-11 DIAGNOSIS — Z9641 Presence of insulin pump (external) (internal): Secondary | ICD-10-CM | POA: Diagnosis not present

## 2023-07-11 DIAGNOSIS — E1140 Type 2 diabetes mellitus with diabetic neuropathy, unspecified: Secondary | ICD-10-CM | POA: Diagnosis not present

## 2023-07-11 DIAGNOSIS — E1065 Type 1 diabetes mellitus with hyperglycemia: Secondary | ICD-10-CM | POA: Diagnosis not present

## 2023-07-11 DIAGNOSIS — Z794 Long term (current) use of insulin: Secondary | ICD-10-CM | POA: Diagnosis not present

## 2023-07-11 DIAGNOSIS — E7800 Pure hypercholesterolemia, unspecified: Secondary | ICD-10-CM | POA: Diagnosis not present

## 2023-07-14 DIAGNOSIS — I83892 Varicose veins of left lower extremities with other complications: Secondary | ICD-10-CM | POA: Diagnosis not present

## 2023-07-21 DIAGNOSIS — I83811 Varicose veins of right lower extremities with pain: Secondary | ICD-10-CM | POA: Diagnosis not present

## 2023-07-21 DIAGNOSIS — M7989 Other specified soft tissue disorders: Secondary | ICD-10-CM | POA: Diagnosis not present

## 2023-07-21 DIAGNOSIS — I83891 Varicose veins of right lower extremities with other complications: Secondary | ICD-10-CM | POA: Diagnosis not present

## 2023-07-28 DIAGNOSIS — I83892 Varicose veins of left lower extremities with other complications: Secondary | ICD-10-CM | POA: Diagnosis not present

## 2023-08-04 DIAGNOSIS — Z7984 Long term (current) use of oral hypoglycemic drugs: Secondary | ICD-10-CM | POA: Diagnosis not present

## 2023-08-04 DIAGNOSIS — H02403 Unspecified ptosis of bilateral eyelids: Secondary | ICD-10-CM | POA: Diagnosis not present

## 2023-08-04 DIAGNOSIS — H04123 Dry eye syndrome of bilateral lacrimal glands: Secondary | ICD-10-CM | POA: Diagnosis not present

## 2023-08-04 DIAGNOSIS — E113292 Type 2 diabetes mellitus with mild nonproliferative diabetic retinopathy without macular edema, left eye: Secondary | ICD-10-CM | POA: Diagnosis not present

## 2023-08-04 DIAGNOSIS — H05401 Unspecified enophthalmos, right eye: Secondary | ICD-10-CM | POA: Diagnosis not present

## 2023-08-04 DIAGNOSIS — H02401 Unspecified ptosis of right eyelid: Secondary | ICD-10-CM | POA: Diagnosis not present

## 2023-08-04 DIAGNOSIS — Z961 Presence of intraocular lens: Secondary | ICD-10-CM | POA: Diagnosis not present

## 2023-08-04 DIAGNOSIS — H35363 Drusen (degenerative) of macula, bilateral: Secondary | ICD-10-CM | POA: Diagnosis not present

## 2023-08-04 DIAGNOSIS — H0288B Meibomian gland dysfunction left eye, upper and lower eyelids: Secondary | ICD-10-CM | POA: Diagnosis not present

## 2023-08-04 DIAGNOSIS — Z794 Long term (current) use of insulin: Secondary | ICD-10-CM | POA: Diagnosis not present

## 2023-08-04 DIAGNOSIS — H5711 Ocular pain, right eye: Secondary | ICD-10-CM | POA: Diagnosis not present

## 2023-08-04 DIAGNOSIS — I83891 Varicose veins of right lower extremities with other complications: Secondary | ICD-10-CM | POA: Diagnosis not present

## 2023-08-04 DIAGNOSIS — H0288A Meibomian gland dysfunction right eye, upper and lower eyelids: Secondary | ICD-10-CM | POA: Diagnosis not present

## 2023-08-04 DIAGNOSIS — E083292 Diabetes mellitus due to underlying condition with mild nonproliferative diabetic retinopathy without macular edema, left eye: Secondary | ICD-10-CM | POA: Diagnosis not present

## 2023-08-06 DIAGNOSIS — M5416 Radiculopathy, lumbar region: Secondary | ICD-10-CM | POA: Diagnosis not present

## 2023-08-06 DIAGNOSIS — M4726 Other spondylosis with radiculopathy, lumbar region: Secondary | ICD-10-CM | POA: Diagnosis not present

## 2023-08-06 DIAGNOSIS — Z6827 Body mass index (BMI) 27.0-27.9, adult: Secondary | ICD-10-CM | POA: Diagnosis not present

## 2023-08-12 DIAGNOSIS — H02401 Unspecified ptosis of right eyelid: Secondary | ICD-10-CM | POA: Diagnosis not present

## 2023-08-12 DIAGNOSIS — J320 Chronic maxillary sinusitis: Secondary | ICD-10-CM | POA: Diagnosis not present

## 2023-08-12 DIAGNOSIS — H5711 Ocular pain, right eye: Secondary | ICD-10-CM | POA: Diagnosis not present

## 2023-08-12 DIAGNOSIS — H05401 Unspecified enophthalmos, right eye: Secondary | ICD-10-CM | POA: Diagnosis not present

## 2023-08-12 DIAGNOSIS — I6523 Occlusion and stenosis of bilateral carotid arteries: Secondary | ICD-10-CM | POA: Diagnosis not present

## 2023-08-13 ENCOUNTER — Other Ambulatory Visit: Payer: Self-pay | Admitting: Neurological Surgery

## 2023-08-13 DIAGNOSIS — M5416 Radiculopathy, lumbar region: Secondary | ICD-10-CM

## 2023-08-19 ENCOUNTER — Telehealth (INDEPENDENT_AMBULATORY_CARE_PROVIDER_SITE_OTHER): Payer: Self-pay | Admitting: Gastroenterology

## 2023-08-19 NOTE — Telephone Encounter (Signed)
 Pt called asking if she could get more Linzess 72 mcg samples. [phone]

## 2023-08-19 NOTE — Telephone Encounter (Signed)
 Patient aware we have # 32 tablets here at the front desk for her to pick up. Patient states understanding and will be by the office to pick up some time this week.

## 2023-08-25 DIAGNOSIS — M5416 Radiculopathy, lumbar region: Secondary | ICD-10-CM | POA: Diagnosis not present

## 2023-08-25 DIAGNOSIS — M545 Low back pain, unspecified: Secondary | ICD-10-CM | POA: Diagnosis not present

## 2023-08-25 DIAGNOSIS — M6281 Muscle weakness (generalized): Secondary | ICD-10-CM | POA: Diagnosis not present

## 2023-08-26 DIAGNOSIS — I83812 Varicose veins of left lower extremities with pain: Secondary | ICD-10-CM | POA: Diagnosis not present

## 2023-08-26 DIAGNOSIS — I83892 Varicose veins of left lower extremities with other complications: Secondary | ICD-10-CM | POA: Diagnosis not present

## 2023-08-27 DIAGNOSIS — M5416 Radiculopathy, lumbar region: Secondary | ICD-10-CM | POA: Diagnosis not present

## 2023-08-27 DIAGNOSIS — M6281 Muscle weakness (generalized): Secondary | ICD-10-CM | POA: Diagnosis not present

## 2023-08-27 DIAGNOSIS — M545 Low back pain, unspecified: Secondary | ICD-10-CM | POA: Diagnosis not present

## 2023-09-02 DIAGNOSIS — M5416 Radiculopathy, lumbar region: Secondary | ICD-10-CM | POA: Diagnosis not present

## 2023-09-02 DIAGNOSIS — M545 Low back pain, unspecified: Secondary | ICD-10-CM | POA: Diagnosis not present

## 2023-09-02 DIAGNOSIS — M6281 Muscle weakness (generalized): Secondary | ICD-10-CM | POA: Diagnosis not present

## 2023-09-04 ENCOUNTER — Encounter: Payer: Self-pay | Admitting: Neurological Surgery

## 2023-09-04 DIAGNOSIS — M545 Low back pain, unspecified: Secondary | ICD-10-CM | POA: Diagnosis not present

## 2023-09-04 DIAGNOSIS — M5416 Radiculopathy, lumbar region: Secondary | ICD-10-CM | POA: Diagnosis not present

## 2023-09-04 DIAGNOSIS — M6281 Muscle weakness (generalized): Secondary | ICD-10-CM | POA: Diagnosis not present

## 2023-09-05 ENCOUNTER — Ambulatory Visit
Admission: RE | Admit: 2023-09-05 | Discharge: 2023-09-05 | Disposition: A | Discharge status: Home / Self Care | Source: Ambulatory Visit | Attending: Neurological Surgery | Admitting: Neurological Surgery

## 2023-09-05 DIAGNOSIS — M48061 Spinal stenosis, lumbar region without neurogenic claudication: Secondary | ICD-10-CM | POA: Diagnosis not present

## 2023-09-05 DIAGNOSIS — M47816 Spondylosis without myelopathy or radiculopathy, lumbar region: Secondary | ICD-10-CM | POA: Diagnosis not present

## 2023-09-05 DIAGNOSIS — M5416 Radiculopathy, lumbar region: Secondary | ICD-10-CM

## 2023-09-08 DIAGNOSIS — M545 Low back pain, unspecified: Secondary | ICD-10-CM | POA: Diagnosis not present

## 2023-09-08 DIAGNOSIS — M6281 Muscle weakness (generalized): Secondary | ICD-10-CM | POA: Diagnosis not present

## 2023-09-08 DIAGNOSIS — M5416 Radiculopathy, lumbar region: Secondary | ICD-10-CM | POA: Diagnosis not present

## 2023-09-09 DIAGNOSIS — H02135 Senile ectropion of left lower eyelid: Secondary | ICD-10-CM | POA: Diagnosis not present

## 2023-09-09 DIAGNOSIS — H04203 Unspecified epiphora, bilateral lacrimal glands: Secondary | ICD-10-CM | POA: Diagnosis not present

## 2023-09-09 DIAGNOSIS — H02132 Senile ectropion of right lower eyelid: Secondary | ICD-10-CM | POA: Diagnosis not present

## 2023-09-09 DIAGNOSIS — R7309 Other abnormal glucose: Secondary | ICD-10-CM | POA: Diagnosis not present

## 2023-09-09 DIAGNOSIS — Z794 Long term (current) use of insulin: Secondary | ICD-10-CM | POA: Diagnosis not present

## 2023-09-09 DIAGNOSIS — H04201 Unspecified epiphora, right lacrimal gland: Secondary | ICD-10-CM | POA: Diagnosis not present

## 2023-09-09 DIAGNOSIS — Z7984 Long term (current) use of oral hypoglycemic drugs: Secondary | ICD-10-CM | POA: Diagnosis not present

## 2023-09-09 DIAGNOSIS — H029 Unspecified disorder of eyelid: Secondary | ICD-10-CM | POA: Diagnosis not present

## 2023-09-09 DIAGNOSIS — E119 Type 2 diabetes mellitus without complications: Secondary | ICD-10-CM | POA: Diagnosis not present

## 2023-09-09 DIAGNOSIS — H04202 Unspecified epiphora, left lacrimal gland: Secondary | ICD-10-CM | POA: Diagnosis not present

## 2023-09-09 DIAGNOSIS — Z9889 Other specified postprocedural states: Secondary | ICD-10-CM | POA: Diagnosis not present

## 2023-09-11 DIAGNOSIS — M5416 Radiculopathy, lumbar region: Secondary | ICD-10-CM | POA: Diagnosis not present

## 2023-09-11 DIAGNOSIS — M545 Low back pain, unspecified: Secondary | ICD-10-CM | POA: Diagnosis not present

## 2023-09-11 DIAGNOSIS — M6281 Muscle weakness (generalized): Secondary | ICD-10-CM | POA: Diagnosis not present

## 2023-09-16 DIAGNOSIS — M545 Low back pain, unspecified: Secondary | ICD-10-CM | POA: Diagnosis not present

## 2023-09-16 DIAGNOSIS — M6281 Muscle weakness (generalized): Secondary | ICD-10-CM | POA: Diagnosis not present

## 2023-09-16 DIAGNOSIS — M5416 Radiculopathy, lumbar region: Secondary | ICD-10-CM | POA: Diagnosis not present

## 2023-09-18 DIAGNOSIS — M6281 Muscle weakness (generalized): Secondary | ICD-10-CM | POA: Diagnosis not present

## 2023-09-18 DIAGNOSIS — M545 Low back pain, unspecified: Secondary | ICD-10-CM | POA: Diagnosis not present

## 2023-09-18 DIAGNOSIS — M5416 Radiculopathy, lumbar region: Secondary | ICD-10-CM | POA: Diagnosis not present

## 2023-09-24 DIAGNOSIS — M5416 Radiculopathy, lumbar region: Secondary | ICD-10-CM | POA: Diagnosis not present

## 2023-09-24 DIAGNOSIS — Z6827 Body mass index (BMI) 27.0-27.9, adult: Secondary | ICD-10-CM | POA: Diagnosis not present

## 2023-09-24 DIAGNOSIS — G501 Atypical facial pain: Secondary | ICD-10-CM | POA: Diagnosis not present

## 2023-09-26 DIAGNOSIS — J323 Chronic sphenoidal sinusitis: Secondary | ICD-10-CM | POA: Diagnosis not present

## 2023-10-08 DIAGNOSIS — E02 Subclinical iodine-deficiency hypothyroidism: Secondary | ICD-10-CM | POA: Diagnosis not present

## 2023-10-08 DIAGNOSIS — E7800 Pure hypercholesterolemia, unspecified: Secondary | ICD-10-CM | POA: Diagnosis not present

## 2023-10-08 DIAGNOSIS — I10 Essential (primary) hypertension: Secondary | ICD-10-CM | POA: Diagnosis not present

## 2023-10-08 DIAGNOSIS — R202 Paresthesia of skin: Secondary | ICD-10-CM | POA: Diagnosis not present

## 2023-10-08 DIAGNOSIS — Z9641 Presence of insulin pump (external) (internal): Secondary | ICD-10-CM | POA: Diagnosis not present

## 2023-10-08 DIAGNOSIS — E1140 Type 2 diabetes mellitus with diabetic neuropathy, unspecified: Secondary | ICD-10-CM | POA: Diagnosis not present

## 2023-10-08 DIAGNOSIS — Z794 Long term (current) use of insulin: Secondary | ICD-10-CM | POA: Diagnosis not present

## 2023-10-08 DIAGNOSIS — E1065 Type 1 diabetes mellitus with hyperglycemia: Secondary | ICD-10-CM | POA: Diagnosis not present

## 2023-10-14 DIAGNOSIS — J323 Chronic sphenoidal sinusitis: Secondary | ICD-10-CM | POA: Diagnosis not present

## 2023-10-21 DIAGNOSIS — E1065 Type 1 diabetes mellitus with hyperglycemia: Secondary | ICD-10-CM | POA: Diagnosis not present

## 2023-10-21 DIAGNOSIS — Z9641 Presence of insulin pump (external) (internal): Secondary | ICD-10-CM | POA: Diagnosis not present

## 2023-11-17 DIAGNOSIS — H02403 Unspecified ptosis of bilateral eyelids: Secondary | ICD-10-CM | POA: Diagnosis not present

## 2023-11-17 DIAGNOSIS — J323 Chronic sphenoidal sinusitis: Secondary | ICD-10-CM | POA: Diagnosis not present

## 2023-11-20 ENCOUNTER — Encounter (HOSPITAL_BASED_OUTPATIENT_CLINIC_OR_DEPARTMENT_OTHER): Payer: Self-pay | Admitting: Otolaryngology

## 2023-11-20 ENCOUNTER — Other Ambulatory Visit: Payer: Self-pay

## 2023-11-21 ENCOUNTER — Other Ambulatory Visit: Payer: Self-pay

## 2023-11-21 ENCOUNTER — Encounter (HOSPITAL_BASED_OUTPATIENT_CLINIC_OR_DEPARTMENT_OTHER)
Admission: RE | Admit: 2023-11-21 | Discharge: 2023-11-21 | Disposition: A | Discharge status: Home / Self Care | Source: Ambulatory Visit | Attending: Otolaryngology | Admitting: Otolaryngology

## 2023-11-21 DIAGNOSIS — Z01818 Encounter for other preprocedural examination: Secondary | ICD-10-CM | POA: Diagnosis not present

## 2023-11-21 LAB — BASIC METABOLIC PANEL WITH GFR
Anion gap: 8 (ref 5–15)
BUN: 19 mg/dL (ref 8–23)
CO2: 28 mmol/L (ref 22–32)
Calcium: 9.1 mg/dL (ref 8.9–10.3)
Chloride: 100 mmol/L (ref 98–111)
Creatinine, Ser: 0.89 mg/dL (ref 0.44–1.00)
GFR, Estimated: >60 mL/min (ref >60)
Glucose, Bld: 225 mg/dL — ABNORMAL HIGH (ref 70–99)
Potassium: 4.6 mmol/L (ref 3.5–5.1)
Sodium: 136 mmol/L (ref 135–145)

## 2023-11-21 NOTE — H&P (Signed)
 HPI:   Kelly Weaver is a 89 y.o. female who presents as a consult Patient.   Referring Provider: No ref. provider found  Chief complaint: Right facial pain.  HPI: She has had intermittent right facial pain now for some undisclosed amount of time. Many afternoons she has to take some ibuprofen for it. She does not really have any chronic nasal symptoms. She had a CT of the sinuses in early March that revealed evidence of chronic right maxillary and sphenoid sinus disease. I have reviewed the CT. There is evidence of calcification in the right sphenoid to suggest fungal disease.  PMH/Meds/All/SocHx/FamHx/ROS:   Past Medical History:  Diagnosis Date  Anatomical narrow angle of right eye 04/22/2013  Atherosclerosis of native artery of extremity with intermittent claudication 04/11/2011  Cataract extraction status of left eye 04/14/2013  Cystoid macular edema 04/22/2013  Diabetes (CMD)  IDDM LBS 140 AM AICHG 7.9  Diabetes mellitus (CMD)  Med Hx: Diabetes mellitus; last A1C 7.6 in April 2019; FBG 147 on 10/14/17  Diabetes mellitus without ophthalmic manifestations (CMD) 10/14/2017  Added automatically from request for surgery [id number]  Diabetes mellitus, type 2 (CMD) 04/03/2011  Epiretinal membrane 05/24/2013  Essential hypertension 06/23/2012  Flank pain 03/01/2013  Fuchs' corneal dystrophy 10/14/2017  Added automatically from request for surgery [id number]  HLD (hyperlipidemia) 04/07/2013  Hyperlipidemia  IDDM (insulin  dependent diabetes mellitus)  IDDM LBS 162 AM AICHG 7.6  Left epiretinal membrane  Lumbar radiculopathy 09/18/2017  Nocturia 04/30/2013  Nonproliferative diabetic retinopathy (CMD) 05/28/2013  Nonproliferative diabetic retinopathy of both eyes (CMD) 04/03/2011  PONV (postoperative nausea and vomiting)  + nausea  Posterior vitreous detachment of left eye  S/P ORIF (open reduction internal fixation) fracture left arm  S/P TKR (total knee replacement) 09/22/2012  S/P  total knee arthroplasty 03/16/2013  Snores  no sleep study  Status post total bilateral knee replacement 04/05/2014  Status post total knee replacement  Traveling blood clot in leg (CMD)   Past Surgical History:  Procedure Laterality Date  BACK SURGERY  Procedure: BACK SURGERY  CATARACT EXTRACTION Left 04/14/13  Procedure: CATARACT EXTRACTION; Burden  CATARACT EXTRACTION W/ INTRAOCULAR LENS IMPLANT Left 04/14/2013  Procedure: PHACOEMULSIFICATION PC / IOL GMO; Surgeon: Hassell Linsey Burden, MD; Location: [facility] OUTPATIENT OR; Service: Ophthalmology; Laterality: Left;  CATARACT EXTRACTION W/ INTRAOCULAR LENS IMPLANT Right 10/22/2017  Procedure: PHACOEMULSIFICATION PC / IOL TOPICAL; Surgeon: Hassell Linsey Burden, MD; Location: [facility] OUTPATIENT OR; Service: Ophthalmology; Laterality: Right;  COLONOSCOPY  Procedure: COLONOSCOPY  MEMBRANECTOMY 06/29/2012  Procedure: MEMBRANE PEEL; Surgeon: Earnie Gola, MD; Location: [facility] OUTPATIENT OR; Service: Ophthalmology; Laterality: Left;  ORIF HUMERAL SHAFT FRACTURE left  Procedure: ORIF HUMERAL SHAFT FRACTURE  PARS PLANA VITRECTOMY 06/29/2012  Procedure: PARS PLANA VITRECTOMY 23GA; Surgeon: Earnie Gola, MD; Location: [facility] OUTPATIENT OR; Service: Ophthalmology; Laterality: Left;  TOTAL KNEE ARTHROPLASTY right  Procedure: REPLACEMENT TOTAL KNEE  TOTAL KNEE ARTHROPLASTY Left  Procedure: REPLACEMENT TOTAL KNEE   No family history of bleeding disorders, wound healing problems or difficulty with anesthesia.     Current Outpatient Medications:  Accu-Chek Guide test strips test strip, 1 each by Other route 3 (three) times a day., Disp: , Rfl:  amoxicillin  (AMOXIL ) 500 mg capsule, , Disp: , Rfl:  aspirin  81 mg EC tablet, Take 81 mg by mouth every morning., Disp: , Rfl:  BisaCODYL  (DULCOLAX) 10 mg suppository, Insert 10 mg into the rectum., Disp: , Rfl:  calcium  carbonate-vitamin D3 (Calcium  600 with Vitamin D3) 600 mg-10 mcg (400 unit) chew,  Take  2 tablets by mouth daily with breakfast., Disp: , Rfl:  candesartan (ATACAND) 4 mg tablet, Take by mouth., Disp: , Rfl:  docusate sodium  (COLACE) 100 mg capsule, Take 300 mg by mouth nightly., Disp: , Rfl:  erythromycin (ROMYCIN) 5 mg/gram (0.5 %) ophthalmic ointment, Apply 0.5 inches to both eyes at bedtime. To eyelid margins, Disp: 3.5 g, Rfl: 11 gabapentin  (NEURONTIN ) 300 mg capsule, Take 300 mg by mouth 2 (two) times a day., Disp: , Rfl:  gatifloxacin (ZYMAXID) 0.5 % drop, Start drop after surgery. Place 1 drop in operative eye 4x/day for 1 week then stop, Disp: 2.5 mL, Rfl: 3 insulin  aspart U-100 (NovoLOG  U-100 Insulin  aspart) 100 unit/mL injection, daily after lunch., Disp: , Rfl:  loratadine  (CLARITIN ) 10 mg tablet, Take 10 mg by mouth Once Daily., Disp: , Rfl:  metFORMIN  (GLUCOPHAGE ) 500 mg tablet, Take 500 mg by mouth daily with breakfast., Disp: , Rfl:  NovoLIN 70/30 U-100 Insulin  100 unit/mL (70-30) injection, , Disp: , Rfl:  omega-3 360-1,200 mg cap, 2 capsules daily with dinner., Disp: , Rfl:  polyvinyl alcohol  (LIQUIFILM TEARS) 1.4 % ophthalmic solution, Administer 1 drop into each eyes nightly., Disp: , Rfl:  prednisoLONE acetate (PRED FORTE) 1 % ophthalmic suspension, Start drop after surgery. Place 1 drop in operative eye 4x/day for 1 week then stop, Disp: 10 mL, Rfl: 3 rivaroxaban (Xarelto) 15 mg tablet, Take 15 mg by mouth 2 (two) times a day., Disp: , Rfl:  rosuvastatin  (Crestor ) 5 mg tablet, Take 5 mg by mouth nightly., Disp: , Rfl:  vitamins A,C,E-zinc-copper (ICaps AREDS) 4,296 mcg-226 mg-90 mg cap, Take 1 capsule by mouth 2 (two) times a day., Disp: 60 each, Rfl: 11 amoxicillin -pot clavulanate (AUGMENTIN) 875-125 mg per tablet, Take 1 tablet by mouth 2 (two) times a day for 10 days., Disp: 20 tablet, Rfl: 0  A complete ROS was performed with pertinent positives/negatives noted in the HPI. The remainder of the ROS are negative.   Physical Exam:   BP 127/71  Pulse  67  Temp 97.6 F (36.4 C) (Temporal)  Ht 1.626 m (5' 4)  Wt 75.9 kg (167 lb 6.4 oz)  BMI 28.73 kg/m   General: Healthy and alert, in no distress, breathing easily. Normal affect. In a pleasant mood. Head: Normocephalic, atraumatic. No masses, or scars. Eyes: Pupils are equal, and reactive to light. Vision is grossly intact. No spontaneous or gaze nystagmus. Ears: Ear canals are clear. Tympanic membranes are intact, with normal landmarks and the middle ears are clear and healthy. Hearing: Grossly normal. Nose: Nasal cavities are clear with healthy mucosa, no polyps or exudate. Airways are patent. Face: No masses or scars, facial nerve function is symmetric. Oral Cavity: No mucosal abnormalities are noted. Tongue with normal mobility. Dentition appears healthy. Oropharynx: Tonsils are symmetric. There are no mucosal masses identified. Tongue base appears normal and healthy. Larynx/Hypopharynx: deferred Chest: Deferred Neck: No palpable masses, no cervical adenopathy, no thyroid  nodules or enlargement. Neuro: Cranial nerves II-XII with normal function. Balance: Normal gate. Other findings: none.  Independent Review of Additional Tests or Records:  Maxillofacial CT:  Procedures:  none  Impression & Plans:  Intermittent right facial pain with CT evidence of isolated right sphenoid sinus disease possibly fungal. Recommend 10 days of Augmentin and then reevaluate. If necessary we will repeat imaging and if this does not clear then endoscopic sphenoid surgery may be the next step.

## 2023-11-24 ENCOUNTER — Telehealth (INDEPENDENT_AMBULATORY_CARE_PROVIDER_SITE_OTHER): Payer: Self-pay | Admitting: Gastroenterology

## 2023-11-24 NOTE — Telephone Encounter (Signed)
 I have placed a months worth of Linzess  72 mcg # 32 at the front desk. Patient aware.

## 2023-11-24 NOTE — Telephone Encounter (Signed)
 Patient called asking for Linzess  samples. [phone]

## 2023-11-25 NOTE — Anesthesia Preprocedure Evaluation (Signed)
 Anesthesia Evaluation  Patient identified by MRN, date of birth, ID band Patient awake    Reviewed: Allergy & Precautions, NPO status , Patient's Chart, lab work & pertinent test results  History of Anesthesia Complications (+) PONV  Airway Mallampati: I  TM Distance: >3 FB Neck ROM: Full    Dental  (+) Missing, Dental Advisory Given   Pulmonary neg pulmonary ROS   breath sounds clear to auscultation       Cardiovascular hypertension, Pt. on medications (-) angina + Peripheral Vascular Disease and + DVT   Rhythm:Regular Rate:Normal     Neuro/Psych negative neurological ROS     GI/Hepatic negative GI ROS, Neg liver ROS,,,  Endo/Other  diabetes (glu 138 by Dexcom), Insulin  Dependent    Renal/GU negative Renal ROS     Musculoskeletal   Abdominal   Peds  Hematology negative hematology ROS (+)   Anesthesia Other Findings   Reproductive/Obstetrics                             Anesthesia Physical Anesthesia Plan  ASA: 3  Anesthesia Plan: General   Post-op Pain Management: Tylenol  PO (pre-op)* and Minimal or no pain anticipated   Induction: Intravenous  PONV Risk Score and Plan: Ondansetron , Dexamethasone, TIVA and Treatment may vary due to age or medical condition  Airway Management Planned: Oral ETT  Additional Equipment: None  Intra-op Plan:   Post-operative Plan: Extubation in OR  Informed Consent: I have reviewed the patients History and Physical, chart, labs and discussed the procedure including the risks, benefits and alternatives for the proposed anesthesia with the patient or authorized representative who has indicated his/her understanding and acceptance.     Dental advisory given  Plan Discussed with: CRNA and Surgeon  Anesthesia Plan Comments:        Anesthesia Quick Evaluation

## 2023-11-26 ENCOUNTER — Encounter (HOSPITAL_BASED_OUTPATIENT_CLINIC_OR_DEPARTMENT_OTHER): Payer: Self-pay | Admitting: Otolaryngology

## 2023-11-26 ENCOUNTER — Ambulatory Visit (HOSPITAL_BASED_OUTPATIENT_CLINIC_OR_DEPARTMENT_OTHER): Payer: Self-pay | Admitting: Anesthesiology

## 2023-11-26 ENCOUNTER — Other Ambulatory Visit: Payer: Self-pay

## 2023-11-26 ENCOUNTER — Ambulatory Visit (HOSPITAL_BASED_OUTPATIENT_CLINIC_OR_DEPARTMENT_OTHER)
Admission: RE | Admit: 2023-11-26 | Discharge: 2023-11-26 | Disposition: A | Discharge status: Home / Self Care | Attending: Otolaryngology | Admitting: Otolaryngology

## 2023-11-26 ENCOUNTER — Encounter (HOSPITAL_BASED_OUTPATIENT_CLINIC_OR_DEPARTMENT_OTHER)
Admission: RE | Disposition: A | Discharge status: Home / Self Care | Payer: Self-pay | Source: Home / Self Care | Attending: Otolaryngology

## 2023-11-26 DIAGNOSIS — E1151 Type 2 diabetes mellitus with diabetic peripheral angiopathy without gangrene: Secondary | ICD-10-CM | POA: Diagnosis not present

## 2023-11-26 DIAGNOSIS — Z01818 Encounter for other preprocedural examination: Secondary | ICD-10-CM

## 2023-11-26 DIAGNOSIS — E119 Type 2 diabetes mellitus without complications: Secondary | ICD-10-CM | POA: Diagnosis not present

## 2023-11-26 DIAGNOSIS — J323 Chronic sphenoidal sinusitis: Secondary | ICD-10-CM | POA: Diagnosis not present

## 2023-11-26 DIAGNOSIS — Z794 Long term (current) use of insulin: Secondary | ICD-10-CM | POA: Diagnosis not present

## 2023-11-26 DIAGNOSIS — I10 Essential (primary) hypertension: Secondary | ICD-10-CM | POA: Diagnosis not present

## 2023-11-26 DIAGNOSIS — Z86718 Personal history of other venous thrombosis and embolism: Secondary | ICD-10-CM | POA: Diagnosis not present

## 2023-11-26 HISTORY — PX: NASAL SINUS SURGERY: SHX719

## 2023-11-26 HISTORY — DX: Essential (primary) hypertension: I10

## 2023-11-26 LAB — GLUCOSE, CAPILLARY
Glucose-Capillary: 127 mg/dL — ABNORMAL HIGH (ref 70–99)
Glucose-Capillary: 145 mg/dL — ABNORMAL HIGH (ref 70–99)

## 2023-11-26 SURGERY — SINUS SURGERY, ENDOSCOPIC
Anesthesia: General | Site: Nose | Laterality: Right

## 2023-11-26 MED ORDER — ROCURONIUM 10MG/ML (10ML) SYRINGE FOR MEDFUSION PUMP - OPTIME
INTRAVENOUS | Status: DC | PRN
Start: 2023-11-26 — End: 2023-11-26
  Administered 2023-11-26: 50 mg via INTRAVENOUS

## 2023-11-26 MED ORDER — LIDOCAINE-EPINEPHRINE 1 %-1:100000 IJ SOLN
INTRAMUSCULAR | Status: DC | PRN
  Administered 2023-11-26: 1 mL

## 2023-11-26 MED ORDER — ONDANSETRON HCL 4 MG/2ML IJ SOLN
INTRAMUSCULAR | Status: DC | PRN
Start: 2023-11-26 — End: 2023-11-26
  Administered 2023-11-26: 4 mg via INTRAVENOUS

## 2023-11-26 MED ORDER — ACETAMINOPHEN 500 MG PO TABS: 1000.0000 mg | ORAL_TABLET | Freq: Once | ORAL | Status: DC

## 2023-11-26 MED ORDER — PROPOFOL 500 MG/50ML IV EMUL
INTRAVENOUS | Status: DC | PRN
Start: 2023-11-26 — End: 2023-11-26
  Administered 2023-11-26: 125 ug/kg/min via INTRAVENOUS

## 2023-11-26 MED ORDER — OXYMETAZOLINE HCL 0.05 % NA SOLN
2.0000 | NASAL | Status: DC
  Administered 2023-11-26: 2 via NASAL

## 2023-11-26 MED ORDER — FENTANYL CITRATE (PF) 100 MCG/2ML IJ SOLN: 25.0000 ug | INTRAMUSCULAR | Status: DC | PRN

## 2023-11-26 MED ORDER — PROPOFOL 10 MG/ML IV BOLUS
INTRAVENOUS | Status: AC
  Filled 2023-11-26: qty 20

## 2023-11-26 MED ORDER — EPHEDRINE SULFATE (PRESSORS) 50 MG/ML IJ SOLN
INTRAMUSCULAR | Status: DC | PRN
Start: 2023-11-26 — End: 2023-11-26
  Administered 2023-11-26: 5 mg via INTRAVENOUS

## 2023-11-26 MED ORDER — ACETAMINOPHEN 10 MG/ML IV SOLN
INTRAVENOUS | Status: AC
  Filled 2023-11-26: qty 100

## 2023-11-26 MED ORDER — LIDOCAINE HCL (CARDIAC) PF 100 MG/5ML IV SOSY
PREFILLED_SYRINGE | INTRAVENOUS | Status: DC | PRN
  Administered 2023-11-26: 20 mg via INTRAVENOUS

## 2023-11-26 MED ORDER — PROPOFOL 10 MG/ML IV BOLUS
INTRAVENOUS | Status: DC | PRN
  Administered 2023-11-26: 100 mg via INTRAVENOUS

## 2023-11-26 MED ORDER — FENTANYL CITRATE (PF) 100 MCG/2ML IJ SOLN
INTRAMUSCULAR | Status: AC
  Filled 2023-11-26: qty 2

## 2023-11-26 MED ORDER — FENTANYL CITRATE (PF) 100 MCG/2ML IJ SOLN
INTRAMUSCULAR | Status: DC | PRN
  Administered 2023-11-26: 100 ug via INTRAVENOUS

## 2023-11-26 MED ORDER — ACETAMINOPHEN 500 MG PO TABS
ORAL_TABLET | ORAL | Status: AC
  Filled 2023-11-26: qty 2

## 2023-11-26 MED ORDER — SUGAMMADEX SODIUM 200 MG/2ML IV SOLN
INTRAVENOUS | Status: DC | PRN
Start: 2023-11-26 — End: 2023-11-26
  Administered 2023-11-26: 400 mg via INTRAVENOUS

## 2023-11-26 MED ORDER — DEXAMETHASONE SODIUM PHOSPHATE 10 MG/ML IJ SOLN
INTRAMUSCULAR | Status: AC
  Filled 2023-11-26: qty 1

## 2023-11-26 MED ORDER — ACETAMINOPHEN 10 MG/ML IV SOLN
INTRAVENOUS | Status: DC | PRN
  Administered 2023-11-26: 1000 mg via INTRAVENOUS

## 2023-11-26 MED ORDER — ONDANSETRON HCL 4 MG/2ML IJ SOLN
INTRAMUSCULAR | Status: AC
  Filled 2023-11-26: qty 2

## 2023-11-26 MED ORDER — LIDOCAINE 2% (20 MG/ML) 5 ML SYRINGE
INTRAMUSCULAR | Status: AC
Start: 2023-11-26 — End: 2023-11-26
  Filled 2023-11-26: qty 5

## 2023-11-26 MED ORDER — OXYMETAZOLINE HCL 0.05 % NA SOLN
NASAL | Status: DC | PRN
  Administered 2023-11-26: 1 via TOPICAL

## 2023-11-26 MED ORDER — LACTATED RINGERS IV SOLN: INTRAVENOUS | Status: DC

## 2023-11-26 MED ORDER — OXYMETAZOLINE HCL 0.05 % NA SOLN
NASAL | Status: AC
  Filled 2023-11-26: qty 30

## 2023-11-26 MED ORDER — DEXAMETHASONE SODIUM PHOSPHATE 4 MG/ML IJ SOLN
INTRAMUSCULAR | Status: DC | PRN
  Administered 2023-11-26: 4 mg via INTRAVENOUS

## 2023-11-26 SURGICAL SUPPLY — 36 items
BLADE RAD40 ROTATE 4M 4 5PK (BLADE) IMPLANT
BLADE RAD60 ROTATE M4 4 5PK (BLADE) IMPLANT
BLADE TRICUT ROTATE M4 4 5PK (BLADE) IMPLANT
BUR HS RAD FRONTAL 3 (BURR) IMPLANT
CANISTER SUC SOCK COL 7IN (MISCELLANEOUS) ×1 IMPLANT
CANISTER SUCT 1200ML W/VALVE (MISCELLANEOUS) ×2 IMPLANT
CORD BIPOLAR FORCEPS 12FT (ELECTRODE) IMPLANT
DEFOGGER MIRROR 1QT (MISCELLANEOUS) ×1 IMPLANT
DRESSING NASAL KENNEDY 3.5X.9 (MISCELLANEOUS) IMPLANT
DRSG CURAD 3X16 NADH (PACKING) IMPLANT
DRSG NASAL KENNEDY LMNT 8CM (GAUZE/BANDAGES/DRESSINGS) IMPLANT
DRSG NASOPORE 8CM (GAUZE/BANDAGES/DRESSINGS) ×1 IMPLANT
DRSG TELFA 3X8 NADH STRL (GAUZE/BANDAGES/DRESSINGS) IMPLANT
FORCEPS BIPOLAR SPETZLER 8 1.0 (NEUROSURGERY SUPPLIES) IMPLANT
GAUZE 4X4 16PLY ~~LOC~~+RFID DBL (SPONGE) IMPLANT
GAUZE SPONGE 2X2 STRL 8-PLY (GAUZE/BANDAGES/DRESSINGS) ×1 IMPLANT
GAUZE VASELINE FOILPK 1/2 X 72 (GAUZE/BANDAGES/DRESSINGS) IMPLANT
GLOVE ECLIPSE 7.5 STRL STRAW (GLOVE) ×1 IMPLANT
GOWN STRL REUS W/ TWL LRG LVL3 (GOWN DISPOSABLE) ×2 IMPLANT
HEMOSTAT SURGICEL .5X2 ABSORB (HEMOSTASIS) IMPLANT
IV NS 500ML BAXH (IV SOLUTION) IMPLANT
NDL PRECISIONGLIDE 27X1.5 (NEEDLE) ×1 IMPLANT
NDL SPNL 25GX3.5 QUINCKE BL (NEEDLE) IMPLANT
NEEDLE PRECISIONGLIDE 27X1.5 (NEEDLE) ×1 IMPLANT
NEEDLE SPNL 25GX3.5 QUINCKE BL (NEEDLE) IMPLANT
NS IRRIG 1000ML POUR BTL (IV SOLUTION) ×1 IMPLANT
PACK BASIN DAY SURGERY FS (CUSTOM PROCEDURE TRAY) ×1 IMPLANT
PACK ENT DAY SURGERY (CUSTOM PROCEDURE TRAY) ×1 IMPLANT
PAD MAGNETIC INSTR ST 16X20 (MISCELLANEOUS) IMPLANT
PATTIES SURGICAL .5 X3 (DISPOSABLE) ×1 IMPLANT
SLEEVE SCD COMPRESS KNEE MED (STOCKING) ×1 IMPLANT
SPIKE FLUID TRANSFER (MISCELLANEOUS) IMPLANT
SPONGE SURGIFOAM ABS GEL 12-7 (HEMOSTASIS) IMPLANT
TOWEL GREEN STERILE FF (TOWEL DISPOSABLE) ×1 IMPLANT
TUBE CONNECTING 20X1/4 (TUBING) IMPLANT
YANKAUER SUCT BULB TIP NO VENT (SUCTIONS) ×1 IMPLANT

## 2023-11-26 NOTE — Interval H&P Note (Signed)
 History and Physical Interval Note:  11/26/2023 8:07 AM  Kelly Weaver  has presented today for surgery, with the diagnosis of Chronic sphenoidal sinusitis.  The various methods of treatment have been discussed with the patient and family. After consideration of risks, benefits and other options for treatment, the patient has consented to  Procedure(s): SINUS SURGERY, ENDOSCOPIC (Right) as a surgical intervention.  The patient's history has been reviewed, patient examined, no change in status, stable for surgery.  I have reviewed the patient's chart and labs.  Questions were answered to the patient's satisfaction.     Ida Loader

## 2023-11-26 NOTE — Anesthesia Postprocedure Evaluation (Signed)
 Anesthesia Post Note  Patient: Kelly Weaver  Procedure(s) Performed: SINUS SURGERY, ENDOSCOPIC (Right: Nose)     Patient location during evaluation: PACU Anesthesia Type: General Level of consciousness: awake and alert, oriented and patient cooperative Pain management: pain level controlled Vital Signs Assessment: post-procedure vital signs reviewed and stable Respiratory status: spontaneous breathing, nonlabored ventilation and respiratory function stable Cardiovascular status: blood pressure returned to baseline and stable Postop Assessment: no apparent nausea or vomiting and adequate PO intake Anesthetic complications: no  No notable events documented.  Last Vitals:  Vitals:   11/26/23 1045 11/26/23 1100  BP: 133/66 (!) 153/64  Pulse: 82   Resp: 16 15  Temp:  (!) 36.2 C  SpO2: 95% 98%    Last Pain:  Vitals:   11/26/23 1100  TempSrc:   PainSc: 0-No pain                 Khairi Garman,E. Deloris Mittag

## 2023-11-26 NOTE — Interval H&P Note (Signed)
 History and Physical Interval Note:  11/26/2023 8:48 AM  Kelly Weaver  has presented today for surgery, with the diagnosis of Chronic sphenoidal sinusitis.  The various methods of treatment have been discussed with the patient and family. After consideration of risks, benefits and other options for treatment, the patient has consented to  Procedure(s): SINUS SURGERY, ENDOSCOPIC (Right) as a surgical intervention.  The patient's history has been reviewed, patient examined, no change in status, stable for surgery.  I have reviewed the patient's chart and labs.  Questions were answered to the patient's satisfaction.     Ida Loader

## 2023-11-26 NOTE — Anesthesia Procedure Notes (Signed)
 Procedure Name: Intubation Date/Time: 11/26/2023 9:44 AM  Performed by: Julieanne Fairy BROCKS, CRNAPre-anesthesia Checklist: Patient identified, Emergency Drugs available, Suction available and Patient being monitored Patient Re-evaluated:Patient Re-evaluated prior to induction Oxygen Delivery Method: Circle system utilized Preoxygenation: Pre-oxygenation with 100% oxygen Induction Type: IV induction Ventilation: Mask ventilation without difficulty Laryngoscope Size: Mac and 4 Grade View: Grade I Tube type: Oral Tube size: 7.0 mm Number of attempts: 1 Airway Equipment and Method: Stylet and Oral airway Placement Confirmation: ETT inserted through vocal cords under direct vision, positive ETCO2 and breath sounds checked- equal and bilateral Secured at: 21 cm Tube secured with: Tape Dental Injury: Teeth and Oropharynx as per pre-operative assessment

## 2023-11-26 NOTE — Op Note (Signed)
 OPERATIVE REPORT  DATE OF SURGERY: 11/26/2023  PATIENT:  Kelly Weaver,  89 y.o. female  PRE-OPERATIVE DIAGNOSIS:  Chronic sphenoidal sinusitis  POST-OPERATIVE DIAGNOSIS:  Chronic sphenoidal sinusitis  PROCEDURE:  Procedure(s): SINUS SURGERY, ENDOSCOPIC, right sphenoid  SURGEON:  Ida VEAR Loader, MD  ASSISTANTS: None  ANESTHESIA:   General   EBL: 10 ml  DRAINS: None  LOCAL MEDICATIONS USED: 1% Xylocaine  with epinephrine   SPECIMEN: Right sphenoid sinus contents  COUNTS:  Correct  PROCEDURE DETAILS: The patient was taken to the operating room and placed on the operating table in the supine position. Following induction of general endotracheal anesthesia, the patient was prepped and draped in the standard fashion for sinus surgery.  Oxymetazoline spray was used preoperatively in the nasal cavities.  Afrin-soaked pledgets were placed in the right nasal cavity.  A spinal needle, 25-gauge was used to infiltrate local anesthetic solution into the sphenoethmoidal recess on the right side.  Using 0 degree endoscope and straight suction the sphenoid face was inspected and purulent secretions were suctioned.  The ostium was entered with the straight suction and a large amount of purulence was suctioned out.  A Kerrison rongeur was used to enlarge the ostia medially and inferiorly.  I was able to pass the 0 degree endoscope into the sinus and there was a large amount of fungal debris along the periphery.  This was cleared out using a injection needle to irrigate with saline.  Large amounts of fungal debris were removed and sent for pathologic evaluation.  After the sinus was cleared there is no further bleeding.  The nasal cavity of nasopharynx was suctioned bloody secretions and the patient was awakened extubated and transferred to recovery in stable condition.    PATIENT DISPOSITION:  To PACU, stable

## 2023-11-26 NOTE — Discharge Instructions (Addendum)
 Use nasal saline spray 10-20 times daily on the right side.  It is okay to use Tylenol  and/or Motrin as needed for pain.  No Tylenol  until 4pm today.

## 2023-11-26 NOTE — Transfer of Care (Signed)
 Immediate Anesthesia Transfer of Care Note  Patient: Kelly Weaver  Procedure(s) Performed: SINUS SURGERY, ENDOSCOPIC (Right: Nose)  Patient Location: PACU  Anesthesia Type:General  Level of Consciousness: awake, alert , and oriented  Airway & Oxygen Therapy: Patient Spontanous Breathing and Patient connected to face mask oxygen  Post-op Assessment: Report given to RN and Post -op Vital signs reviewed and stable  Post vital signs: Reviewed and stable  Last Vitals:  Vitals Value Taken Time  BP 117/42 11/26/23 10:20  Temp    Pulse 65 11/26/23 10:23  Resp 11 11/26/23 10:23  SpO2 99 % 11/26/23 10:23  Vitals shown include unfiled device data.  Last Pain:  Vitals:   11/26/23 0851  TempSrc: Temporal  PainSc: 0-No pain         Complications: No notable events documented.

## 2023-11-27 ENCOUNTER — Encounter (HOSPITAL_BASED_OUTPATIENT_CLINIC_OR_DEPARTMENT_OTHER): Payer: Self-pay | Admitting: Otolaryngology

## 2023-11-27 LAB — SURGICAL PATHOLOGY

## 2023-12-03 DIAGNOSIS — Z1321 Encounter for screening for nutritional disorder: Secondary | ICD-10-CM | POA: Diagnosis not present

## 2023-12-03 DIAGNOSIS — Z1329 Encounter for screening for other suspected endocrine disorder: Secondary | ICD-10-CM | POA: Diagnosis not present

## 2023-12-03 DIAGNOSIS — E1142 Type 2 diabetes mellitus with diabetic polyneuropathy: Secondary | ICD-10-CM | POA: Diagnosis not present

## 2023-12-03 DIAGNOSIS — E7849 Other hyperlipidemia: Secondary | ICD-10-CM | POA: Diagnosis not present

## 2023-12-03 DIAGNOSIS — Z0001 Encounter for general adult medical examination with abnormal findings: Secondary | ICD-10-CM | POA: Diagnosis not present

## 2023-12-03 DIAGNOSIS — E1110 Type 2 diabetes mellitus with ketoacidosis without coma: Secondary | ICD-10-CM | POA: Diagnosis not present

## 2023-12-10 DIAGNOSIS — E559 Vitamin D deficiency, unspecified: Secondary | ICD-10-CM | POA: Diagnosis not present

## 2023-12-10 DIAGNOSIS — Z6830 Body mass index (BMI) 30.0-30.9, adult: Secondary | ICD-10-CM | POA: Diagnosis not present

## 2023-12-10 DIAGNOSIS — E1142 Type 2 diabetes mellitus with diabetic polyneuropathy: Secondary | ICD-10-CM | POA: Diagnosis not present

## 2023-12-10 DIAGNOSIS — I10 Essential (primary) hypertension: Secondary | ICD-10-CM | POA: Diagnosis not present

## 2023-12-10 DIAGNOSIS — E782 Mixed hyperlipidemia: Secondary | ICD-10-CM | POA: Diagnosis not present

## 2023-12-10 DIAGNOSIS — E7849 Other hyperlipidemia: Secondary | ICD-10-CM | POA: Diagnosis not present

## 2023-12-11 DIAGNOSIS — J323 Chronic sphenoidal sinusitis: Secondary | ICD-10-CM | POA: Diagnosis not present

## 2023-12-23 DIAGNOSIS — R600 Localized edema: Secondary | ICD-10-CM | POA: Diagnosis not present

## 2023-12-23 DIAGNOSIS — L819 Disorder of pigmentation, unspecified: Secondary | ICD-10-CM | POA: Diagnosis not present

## 2023-12-23 DIAGNOSIS — I87393 Chronic venous hypertension (idiopathic) with other complications of bilateral lower extremity: Secondary | ICD-10-CM | POA: Diagnosis not present

## 2023-12-23 DIAGNOSIS — I8311 Varicose veins of right lower extremity with inflammation: Secondary | ICD-10-CM | POA: Diagnosis not present

## 2023-12-23 DIAGNOSIS — I8312 Varicose veins of left lower extremity with inflammation: Secondary | ICD-10-CM | POA: Diagnosis not present

## 2024-01-07 DIAGNOSIS — R202 Paresthesia of skin: Secondary | ICD-10-CM | POA: Diagnosis not present

## 2024-01-07 DIAGNOSIS — E7800 Pure hypercholesterolemia, unspecified: Secondary | ICD-10-CM | POA: Diagnosis not present

## 2024-01-07 DIAGNOSIS — E1140 Type 2 diabetes mellitus with diabetic neuropathy, unspecified: Secondary | ICD-10-CM | POA: Diagnosis not present

## 2024-01-07 DIAGNOSIS — E02 Subclinical iodine-deficiency hypothyroidism: Secondary | ICD-10-CM | POA: Diagnosis not present

## 2024-01-07 DIAGNOSIS — I10 Essential (primary) hypertension: Secondary | ICD-10-CM | POA: Diagnosis not present

## 2024-01-07 DIAGNOSIS — Z9641 Presence of insulin pump (external) (internal): Secondary | ICD-10-CM | POA: Diagnosis not present

## 2024-01-07 DIAGNOSIS — E1065 Type 1 diabetes mellitus with hyperglycemia: Secondary | ICD-10-CM | POA: Diagnosis not present

## 2024-01-07 DIAGNOSIS — Z794 Long term (current) use of insulin: Secondary | ICD-10-CM | POA: Diagnosis not present

## 2024-01-13 DIAGNOSIS — I83892 Varicose veins of left lower extremities with other complications: Secondary | ICD-10-CM | POA: Diagnosis not present

## 2024-01-20 DIAGNOSIS — I83812 Varicose veins of left lower extremities with pain: Secondary | ICD-10-CM | POA: Diagnosis not present

## 2024-01-20 DIAGNOSIS — I83892 Varicose veins of left lower extremities with other complications: Secondary | ICD-10-CM | POA: Diagnosis not present

## 2024-02-03 DIAGNOSIS — I83892 Varicose veins of left lower extremities with other complications: Secondary | ICD-10-CM | POA: Diagnosis not present

## 2024-02-05 ENCOUNTER — Telehealth (INDEPENDENT_AMBULATORY_CARE_PROVIDER_SITE_OTHER): Payer: Self-pay | Admitting: Gastroenterology

## 2024-02-05 NOTE — Telephone Encounter (Signed)
 Patient aware we have placed six boxes of four (24 tablets) at the front desk for her to pick up. Patient says she will pick up from the office on 02/06/2024.

## 2024-02-05 NOTE — Telephone Encounter (Signed)
 Patient called asking if we had any samples of Linzess  72mcg. She would like to get some either today or tomorrow. [phone]

## 2024-03-30 DIAGNOSIS — H0014 Chalazion left upper eyelid: Secondary | ICD-10-CM | POA: Diagnosis not present

## 2024-03-30 DIAGNOSIS — H0288A Meibomian gland dysfunction right eye, upper and lower eyelids: Secondary | ICD-10-CM | POA: Diagnosis not present

## 2024-03-30 DIAGNOSIS — H04123 Dry eye syndrome of bilateral lacrimal glands: Secondary | ICD-10-CM | POA: Diagnosis not present

## 2024-03-30 DIAGNOSIS — H0288B Meibomian gland dysfunction left eye, upper and lower eyelids: Secondary | ICD-10-CM | POA: Diagnosis not present

## 2024-04-01 ENCOUNTER — Encounter (INDEPENDENT_AMBULATORY_CARE_PROVIDER_SITE_OTHER): Payer: Self-pay | Admitting: Gastroenterology

## 2024-04-06 ENCOUNTER — Telehealth (INDEPENDENT_AMBULATORY_CARE_PROVIDER_SITE_OTHER): Payer: Self-pay | Admitting: Gastroenterology

## 2024-04-06 ENCOUNTER — Other Ambulatory Visit (INDEPENDENT_AMBULATORY_CARE_PROVIDER_SITE_OTHER): Payer: Self-pay

## 2024-04-06 DIAGNOSIS — K5904 Chronic idiopathic constipation: Secondary | ICD-10-CM

## 2024-04-06 MED ORDER — LINACLOTIDE 72 MCG PO CAPS: 72.0000 ug | ORAL_CAPSULE | Freq: Every day | ORAL | Status: DC

## 2024-04-06 NOTE — Telephone Encounter (Signed)
 Pt is aware of her one year follow up with Dr Eartha on 12/4 at 230pm. She is asking if she can get samples or a prescription sent in to hold her until her OV. She uses Linzess  72mcg. [phone]

## 2024-04-06 NOTE — Telephone Encounter (Signed)
 I spoke with the patient and made her aware we have her # 28 capsules at the front desk for her to pick up. I have documented this in her chart.

## 2024-04-07 ENCOUNTER — Encounter (INDEPENDENT_AMBULATORY_CARE_PROVIDER_SITE_OTHER): Payer: Self-pay | Admitting: Gastroenterology

## 2024-04-07 DIAGNOSIS — Z23 Encounter for immunization: Secondary | ICD-10-CM | POA: Diagnosis not present

## 2024-04-08 DIAGNOSIS — E02 Subclinical iodine-deficiency hypothyroidism: Secondary | ICD-10-CM | POA: Diagnosis not present

## 2024-04-08 DIAGNOSIS — Z9641 Presence of insulin pump (external) (internal): Secondary | ICD-10-CM | POA: Diagnosis not present

## 2024-04-08 DIAGNOSIS — E1140 Type 2 diabetes mellitus with diabetic neuropathy, unspecified: Secondary | ICD-10-CM | POA: Diagnosis not present

## 2024-04-08 DIAGNOSIS — E7800 Pure hypercholesterolemia, unspecified: Secondary | ICD-10-CM | POA: Diagnosis not present

## 2024-04-08 DIAGNOSIS — E1065 Type 1 diabetes mellitus with hyperglycemia: Secondary | ICD-10-CM | POA: Diagnosis not present

## 2024-04-08 DIAGNOSIS — R202 Paresthesia of skin: Secondary | ICD-10-CM | POA: Diagnosis not present

## 2024-04-08 DIAGNOSIS — Z794 Long term (current) use of insulin: Secondary | ICD-10-CM | POA: Diagnosis not present

## 2024-04-08 DIAGNOSIS — I10 Essential (primary) hypertension: Secondary | ICD-10-CM | POA: Diagnosis not present

## 2024-04-21 DIAGNOSIS — I8311 Varicose veins of right lower extremity with inflammation: Secondary | ICD-10-CM | POA: Diagnosis not present

## 2024-04-21 DIAGNOSIS — R600 Localized edema: Secondary | ICD-10-CM | POA: Diagnosis not present

## 2024-04-21 DIAGNOSIS — I87393 Chronic venous hypertension (idiopathic) with other complications of bilateral lower extremity: Secondary | ICD-10-CM | POA: Diagnosis not present

## 2024-04-21 DIAGNOSIS — I8312 Varicose veins of left lower extremity with inflammation: Secondary | ICD-10-CM | POA: Diagnosis not present

## 2024-05-06 ENCOUNTER — Ambulatory Visit (INDEPENDENT_AMBULATORY_CARE_PROVIDER_SITE_OTHER): Admitting: Gastroenterology

## 2024-05-10 ENCOUNTER — Ambulatory Visit (INDEPENDENT_AMBULATORY_CARE_PROVIDER_SITE_OTHER): Admitting: Gastroenterology

## 2024-05-10 ENCOUNTER — Encounter (INDEPENDENT_AMBULATORY_CARE_PROVIDER_SITE_OTHER): Payer: Self-pay | Admitting: Gastroenterology

## 2024-05-10 VITALS — BP 118/63 | HR 66 | Temp 97.8°F | Ht 65.0 in | Wt 172.3 lb

## 2024-05-10 DIAGNOSIS — K5909 Other constipation: Secondary | ICD-10-CM | POA: Diagnosis not present

## 2024-05-10 DIAGNOSIS — K5904 Chronic idiopathic constipation: Secondary | ICD-10-CM

## 2024-05-10 MED ORDER — LINACLOTIDE 72 MCG PO CAPS: 72.0000 ug | ORAL_CAPSULE | Freq: Every day | ORAL | Status: AC
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
## 2024-05-10 NOTE — Patient Instructions (Signed)
 Continue Linzess  72 mcg every other day, may increase to daily dosing if presenting worsening constipation.  Samples provided today (#12).

## 2024-05-31 ENCOUNTER — Telehealth (INDEPENDENT_AMBULATORY_CARE_PROVIDER_SITE_OTHER): Payer: Self-pay

## 2024-05-31 MED ORDER — LINACLOTIDE 72 MCG PO CAPS: 72.0000 ug | ORAL_CAPSULE | Freq: Every day | ORAL | 0 refills | Status: AC

## 2024-05-31 NOTE — Telephone Encounter (Signed)
 Thank you :)

## 2024-05-31 NOTE — Telephone Encounter (Signed)
 Patient called today to see if she can get some samples of Linzess  72 mcg. I have two boxes of Linzess  72 mcg # 8 capsules that I placed at the front desk, and I left a message on patient vm, that I did so.

## 2024-06-24 ENCOUNTER — Telehealth (INDEPENDENT_AMBULATORY_CARE_PROVIDER_SITE_OTHER): Payer: Self-pay | Admitting: Gastroenterology

## 2024-06-24 NOTE — Telephone Encounter (Signed)
 Pt came into office requesting Linzess  72 mcg samples. Gave pt 2 boxes  Medication Samples have been provided to the patient.  Drug name: Linzess        Strength: 72 mcg        Qty: 2 boxes   LOT: [id number]  Exp.Date: 06/2026  Dosing instructions: take one capsule po daily   The patient has been instructed regarding the correct time, dose, and frequency of taking this medication, including desired effects and most common side effects.   Glenys Bruns 2:18 PM 06/24/2024
